# Patient Record
Sex: Female | Born: 1960 | Smoking: Former smoker
Health system: Southern US, Community
[De-identification: ages and names within clinical notes are randomized; demographics above are authoritative.]

## PROBLEM LIST (undated history)

## (undated) DIAGNOSIS — M2550 Pain in unspecified joint: Secondary | ICD-10-CM

## (undated) DIAGNOSIS — F329 Major depressive disorder, single episode, unspecified: Secondary | ICD-10-CM

## (undated) DIAGNOSIS — F419 Anxiety disorder, unspecified: Secondary | ICD-10-CM

## (undated) DIAGNOSIS — K5900 Constipation, unspecified: Secondary | ICD-10-CM

## (undated) DIAGNOSIS — F32A Depression, unspecified: Secondary | ICD-10-CM

## (undated) DIAGNOSIS — K829 Disease of gallbladder, unspecified: Secondary | ICD-10-CM

## (undated) DIAGNOSIS — E039 Hypothyroidism, unspecified: Secondary | ICD-10-CM

## (undated) DIAGNOSIS — G4700 Insomnia, unspecified: Secondary | ICD-10-CM

## (undated) DIAGNOSIS — E669 Obesity, unspecified: Secondary | ICD-10-CM

## (undated) HISTORY — PX: TUBAL LIGATION: SHX77

## (undated) HISTORY — DX: Hypothyroidism, unspecified: E03.9

## (undated) HISTORY — DX: Constipation, unspecified: K59.00

## (undated) HISTORY — DX: Disease of gallbladder, unspecified: K82.9

## (undated) HISTORY — DX: Obesity, unspecified: E66.9

## (undated) HISTORY — DX: Insomnia, unspecified: G47.00

## (undated) HISTORY — PX: GALLBLADDER SURGERY: SHX652

## (undated) HISTORY — DX: Pain in unspecified joint: M25.50

## (undated) HISTORY — DX: Anxiety disorder, unspecified: F41.9

## (undated) HISTORY — DX: Depression, unspecified: F32.A

## (undated) HISTORY — DX: Major depressive disorder, single episode, unspecified: F32.9

---

## 2010-06-04 ENCOUNTER — Other Ambulatory Visit (HOSPITAL_COMMUNITY)
Admission: RE | Admit: 2010-06-04 | Discharge: 2010-06-04 | Disposition: A | Discharge status: Home / Self Care | Payer: BC Managed Care – PPO | Source: Ambulatory Visit | Attending: Family Medicine | Admitting: Family Medicine

## 2010-06-04 DIAGNOSIS — Z124 Encounter for screening for malignant neoplasm of cervix: Secondary | ICD-10-CM | POA: Insufficient documentation

## 2014-01-03 ENCOUNTER — Other Ambulatory Visit: Payer: Self-pay | Admitting: Occupational Medicine

## 2014-01-03 ENCOUNTER — Ambulatory Visit: Payer: Self-pay

## 2014-01-03 DIAGNOSIS — M25572 Pain in left ankle and joints of left foot: Secondary | ICD-10-CM

## 2014-06-02 ENCOUNTER — Other Ambulatory Visit: Payer: Self-pay

## 2014-06-02 ENCOUNTER — Other Ambulatory Visit: Payer: Self-pay | Admitting: Family Medicine

## 2014-06-02 ENCOUNTER — Other Ambulatory Visit (HOSPITAL_COMMUNITY)
Admission: RE | Admit: 2014-06-02 | Discharge: 2014-06-02 | Disposition: A | Discharge status: Home / Self Care | Payer: 59 | Source: Ambulatory Visit | Attending: Family Medicine | Admitting: Family Medicine

## 2014-06-02 DIAGNOSIS — Z1231 Encounter for screening mammogram for malignant neoplasm of breast: Secondary | ICD-10-CM

## 2014-06-02 DIAGNOSIS — Z124 Encounter for screening for malignant neoplasm of cervix: Secondary | ICD-10-CM | POA: Insufficient documentation

## 2014-06-07 LAB — CYTOLOGY - PAP

## 2014-06-18 ENCOUNTER — Encounter: Payer: 59 | Attending: Family Medicine | Admitting: Dietician

## 2014-06-18 DIAGNOSIS — Z713 Dietary counseling and surveillance: Secondary | ICD-10-CM | POA: Insufficient documentation

## 2014-06-18 NOTE — Progress Notes (Signed)
Patient was seen on 06/18/2014 for the Weight Loss Class at the [facility]. The following learning objectives were met by the patient during this class:   Describe healthy choices in each food group  Describe portion size of foods  Use plate method for meal planning  Demonstrate how to read Nutrition Facts food label  Set realistic goals for weight loss, diet changes, and physical activity.   Goals:  1. Make healthy food choices in each food group.  2. Reduce portion size of foods.  3. Increase fruit and vegetable intake.  4. Use plate method for meal planning.  5. Increase physical activity.    Handouts given:   1. Nutrition Strategies for Weight Loss   2. Meal plan/portion card   3. MyPlate Planner   4. Weight Management Recipe Resources   5. Bake, Broil, Grill  6. Nutrition Fact Label  7. Vegetarian Protein List  8. Low Carb Snack Suggestions

## 2014-06-20 ENCOUNTER — Encounter (INDEPENDENT_AMBULATORY_CARE_PROVIDER_SITE_OTHER): Payer: Self-pay

## 2014-06-20 ENCOUNTER — Ambulatory Visit
Admission: RE | Admit: 2014-06-20 | Discharge: 2014-06-20 | Disposition: A | Discharge status: Home / Self Care | Payer: 59 | Source: Ambulatory Visit

## 2014-06-20 DIAGNOSIS — Z1231 Encounter for screening mammogram for malignant neoplasm of breast: Secondary | ICD-10-CM

## 2014-06-23 ENCOUNTER — Other Ambulatory Visit: Payer: Self-pay | Admitting: Family Medicine

## 2014-06-23 DIAGNOSIS — N63 Unspecified lump in unspecified breast: Secondary | ICD-10-CM

## 2014-06-28 ENCOUNTER — Ambulatory Visit
Admission: RE | Admit: 2014-06-28 | Discharge: 2014-06-28 | Disposition: A | Discharge status: Home / Self Care | Payer: 59 | Source: Ambulatory Visit | Attending: Family Medicine | Admitting: Family Medicine

## 2014-06-28 DIAGNOSIS — N63 Unspecified lump in unspecified breast: Secondary | ICD-10-CM

## 2014-12-13 ENCOUNTER — Other Ambulatory Visit: Payer: Self-pay | Admitting: Family Medicine

## 2014-12-13 DIAGNOSIS — N6320 Unspecified lump in the left breast, unspecified quadrant: Secondary | ICD-10-CM

## 2014-12-26 ENCOUNTER — Other Ambulatory Visit: Payer: Self-pay

## 2015-02-15 DIAGNOSIS — F4310 Post-traumatic stress disorder, unspecified: Secondary | ICD-10-CM | POA: Diagnosis not present

## 2015-02-15 DIAGNOSIS — F331 Major depressive disorder, recurrent, moderate: Secondary | ICD-10-CM | POA: Diagnosis not present

## 2015-02-17 MED FILL — TRINTELLIX 20 MG TABLET: 20 | 90 days supply | Qty: 90 | Fill #0

## 2015-02-23 MED FILL — SYNTHROID 137 MCG TABLET: 137 | 30 days supply | Qty: 30 | Fill #0

## 2015-03-01 DIAGNOSIS — F4310 Post-traumatic stress disorder, unspecified: Secondary | ICD-10-CM | POA: Diagnosis not present

## 2015-03-01 DIAGNOSIS — F331 Major depressive disorder, recurrent, moderate: Secondary | ICD-10-CM | POA: Diagnosis not present

## 2015-03-08 DIAGNOSIS — F331 Major depressive disorder, recurrent, moderate: Secondary | ICD-10-CM | POA: Diagnosis not present

## 2015-03-08 DIAGNOSIS — F4310 Post-traumatic stress disorder, unspecified: Secondary | ICD-10-CM | POA: Diagnosis not present

## 2015-03-15 DIAGNOSIS — F4310 Post-traumatic stress disorder, unspecified: Secondary | ICD-10-CM | POA: Diagnosis not present

## 2015-03-15 DIAGNOSIS — F331 Major depressive disorder, recurrent, moderate: Secondary | ICD-10-CM | POA: Diagnosis not present

## 2015-03-21 DIAGNOSIS — F331 Major depressive disorder, recurrent, moderate: Secondary | ICD-10-CM | POA: Diagnosis not present

## 2015-03-21 DIAGNOSIS — F4310 Post-traumatic stress disorder, unspecified: Secondary | ICD-10-CM | POA: Diagnosis not present

## 2015-04-05 DIAGNOSIS — F331 Major depressive disorder, recurrent, moderate: Secondary | ICD-10-CM | POA: Diagnosis not present

## 2015-04-05 DIAGNOSIS — F4310 Post-traumatic stress disorder, unspecified: Secondary | ICD-10-CM | POA: Diagnosis not present

## 2015-04-05 MED FILL — SYNTHROID 137 MCG TABLET: 137 | 90 days supply | Qty: 90 | Fill #0

## 2015-05-08 DIAGNOSIS — J029 Acute pharyngitis, unspecified: Secondary | ICD-10-CM | POA: Diagnosis not present

## 2015-05-08 MED FILL — AMOX-CLAV 500-125 MG TABLET: 500-125 | 10 days supply | Qty: 20 | Fill #0

## 2015-05-29 DIAGNOSIS — G479 Sleep disorder, unspecified: Secondary | ICD-10-CM | POA: Diagnosis not present

## 2015-05-29 DIAGNOSIS — Z1211 Encounter for screening for malignant neoplasm of colon: Secondary | ICD-10-CM | POA: Diagnosis not present

## 2015-05-29 DIAGNOSIS — E039 Hypothyroidism, unspecified: Secondary | ICD-10-CM | POA: Diagnosis not present

## 2015-05-29 DIAGNOSIS — R928 Other abnormal and inconclusive findings on diagnostic imaging of breast: Secondary | ICD-10-CM | POA: Diagnosis not present

## 2015-05-29 DIAGNOSIS — L709 Acne, unspecified: Secondary | ICD-10-CM | POA: Diagnosis not present

## 2015-05-29 DIAGNOSIS — Z0000 Encounter for general adult medical examination without abnormal findings: Secondary | ICD-10-CM | POA: Diagnosis not present

## 2015-05-29 DIAGNOSIS — F339 Major depressive disorder, recurrent, unspecified: Secondary | ICD-10-CM | POA: Diagnosis not present

## 2015-05-29 DIAGNOSIS — F419 Anxiety disorder, unspecified: Secondary | ICD-10-CM | POA: Diagnosis not present

## 2015-05-30 ENCOUNTER — Other Ambulatory Visit: Payer: Self-pay | Admitting: Family Medicine

## 2015-05-30 DIAGNOSIS — N6320 Unspecified lump in the left breast, unspecified quadrant: Secondary | ICD-10-CM

## 2015-06-06 MED FILL — TRINTELLIX 20 MG TABLET: 20 | 90 days supply | Qty: 90 | Fill #0

## 2015-06-29 ENCOUNTER — Other Ambulatory Visit: Payer: Self-pay

## 2015-07-13 MED FILL — SYNTHROID 137 MCG TABLET: 137 | 90 days supply | Qty: 90 | Fill #0

## 2015-08-25 ENCOUNTER — Ambulatory Visit
Admission: RE | Admit: 2015-08-25 | Discharge: 2015-08-25 | Disposition: A | Discharge status: Home / Self Care | Payer: 59 | Source: Ambulatory Visit | Attending: Family Medicine | Admitting: Family Medicine

## 2015-08-25 DIAGNOSIS — N6320 Unspecified lump in the left breast, unspecified quadrant: Secondary | ICD-10-CM

## 2015-08-25 DIAGNOSIS — N63 Unspecified lump in breast: Secondary | ICD-10-CM | POA: Diagnosis not present

## 2015-09-18 MED FILL — TRINTELLIX 20 MG TABLET: 20 | 90 days supply | Qty: 90 | Fill #1

## 2015-11-01 MED FILL — SYNTHROID 137 MCG TABLET: 137 | 90 days supply | Qty: 90 | Fill #1

## 2015-12-20 DIAGNOSIS — E039 Hypothyroidism, unspecified: Secondary | ICD-10-CM | POA: Diagnosis not present

## 2015-12-20 DIAGNOSIS — Z713 Dietary counseling and surveillance: Secondary | ICD-10-CM | POA: Diagnosis not present

## 2015-12-20 DIAGNOSIS — E6609 Other obesity due to excess calories: Secondary | ICD-10-CM | POA: Diagnosis not present

## 2015-12-20 DIAGNOSIS — Z6833 Body mass index (BMI) 33.0-33.9, adult: Secondary | ICD-10-CM | POA: Diagnosis not present

## 2015-12-20 MED FILL — QSYMIA 3.75 MG-23 MG CAP: 3.75-23 | 14 days supply | Qty: 14 | Fill #0

## 2015-12-21 MED FILL — TRETINOIN 0.025% CREAM: 0.025 | 30 days supply | Qty: 20 | Fill #0

## 2016-01-01 MED FILL — QSYMIA 3.75 MG-23 MG CAP: 3.75-23 | 15 days supply | Qty: 30 | Fill #1

## 2016-01-25 ENCOUNTER — Encounter (INDEPENDENT_AMBULATORY_CARE_PROVIDER_SITE_OTHER): Payer: 59 | Admitting: Family Medicine

## 2016-02-07 MED FILL — SYNTHROID 137 MCG TABLET: 137 | 90 days supply | Qty: 90 | Fill #2

## 2016-02-09 MED FILL — TRINTELLIX 20 MG TABLET: 20 | 90 days supply | Qty: 90 | Fill #2

## 2016-02-12 IMAGING — CR DG ANKLE COMPLETE 3+V*L*
3 series · 3 of 3 positions shown · non-contrast
Comparison: None.

CLINICAL DATA: Fell today with pain laterally

EXAM:
LEFT ANKLE COMPLETE - 3+ VIEW

[view not recorded (1 of 3)]
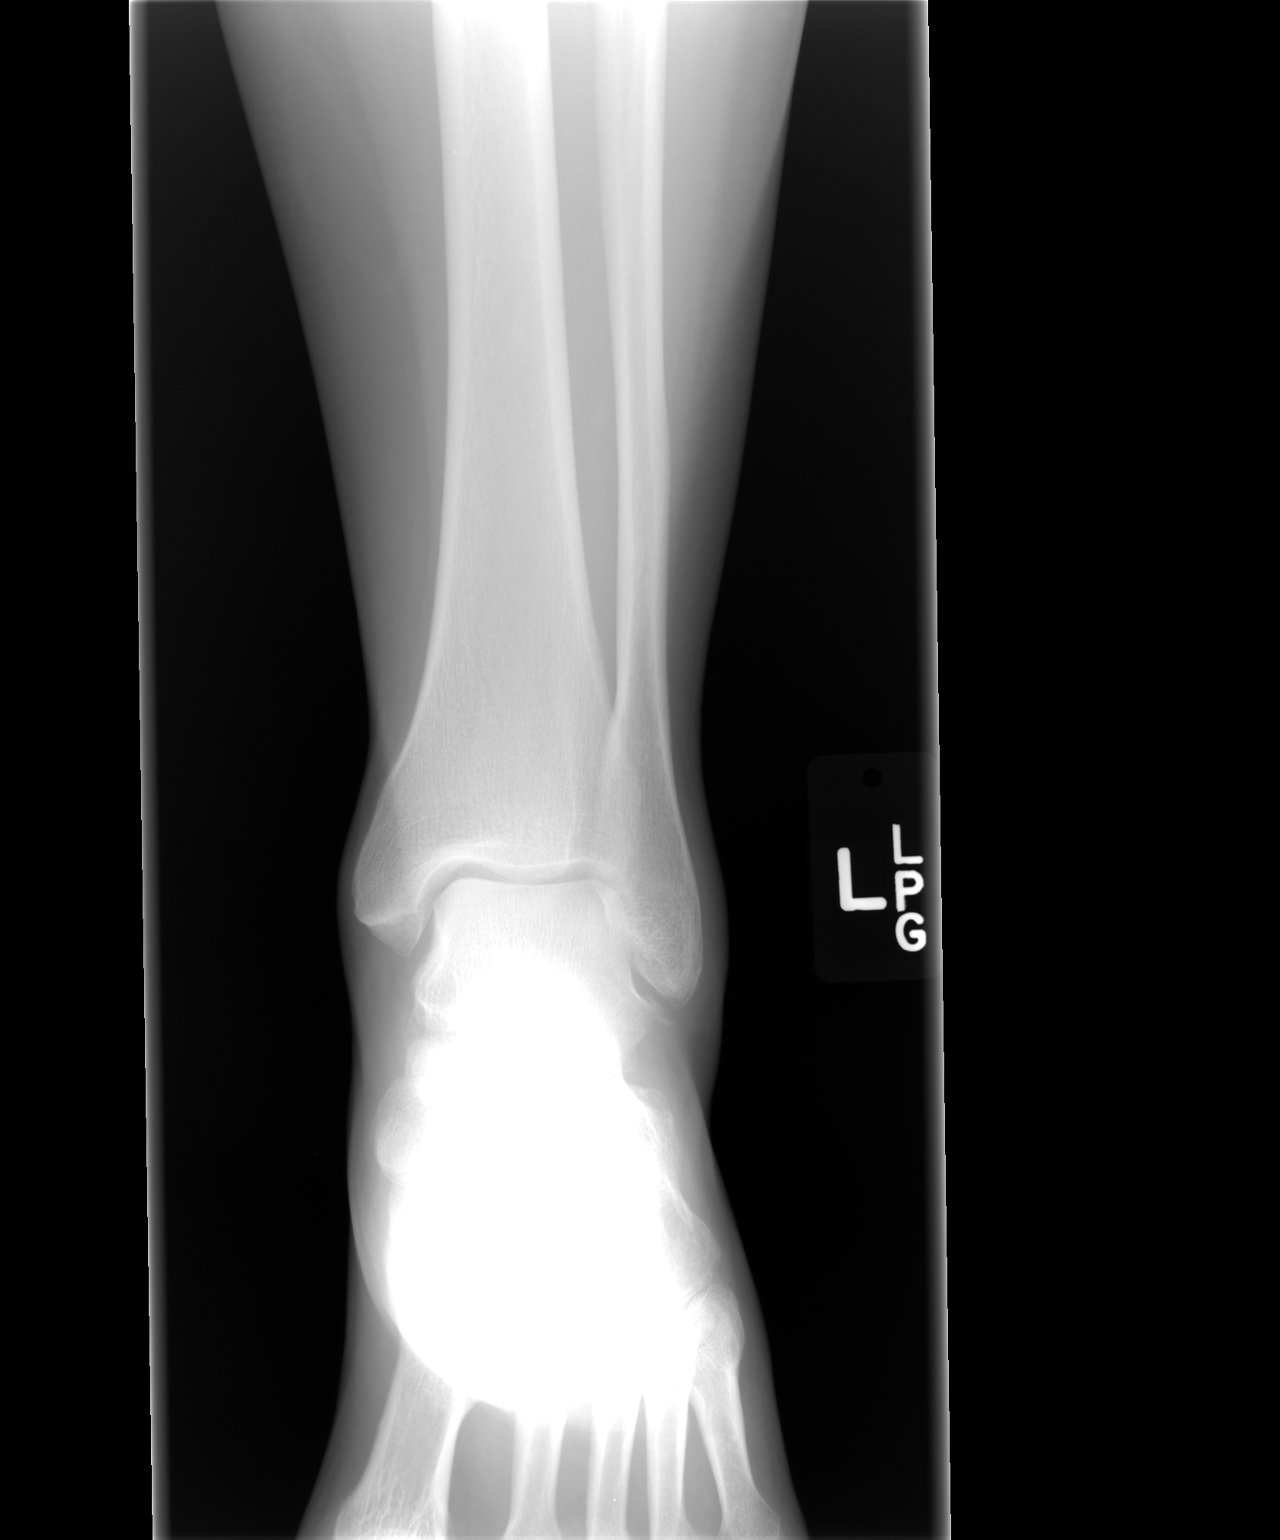

[view not recorded (2 of 3)]
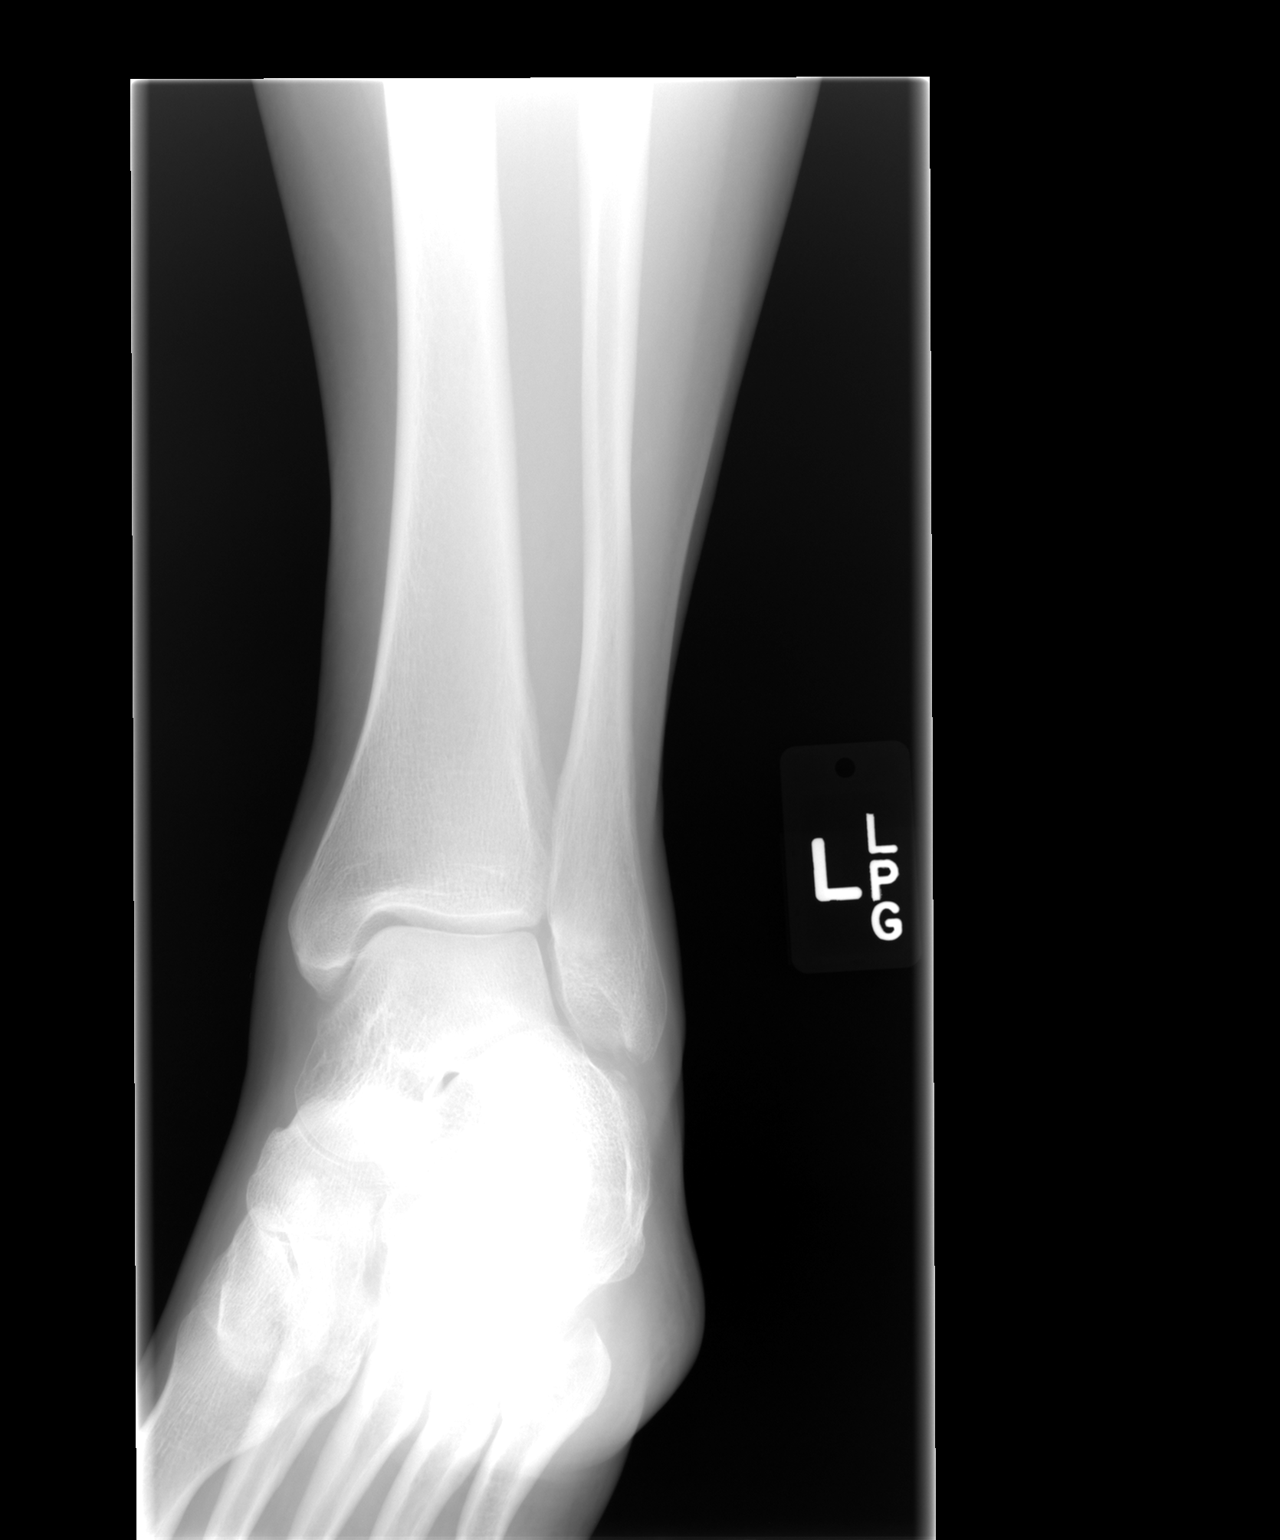

[view not recorded (3 of 3)]
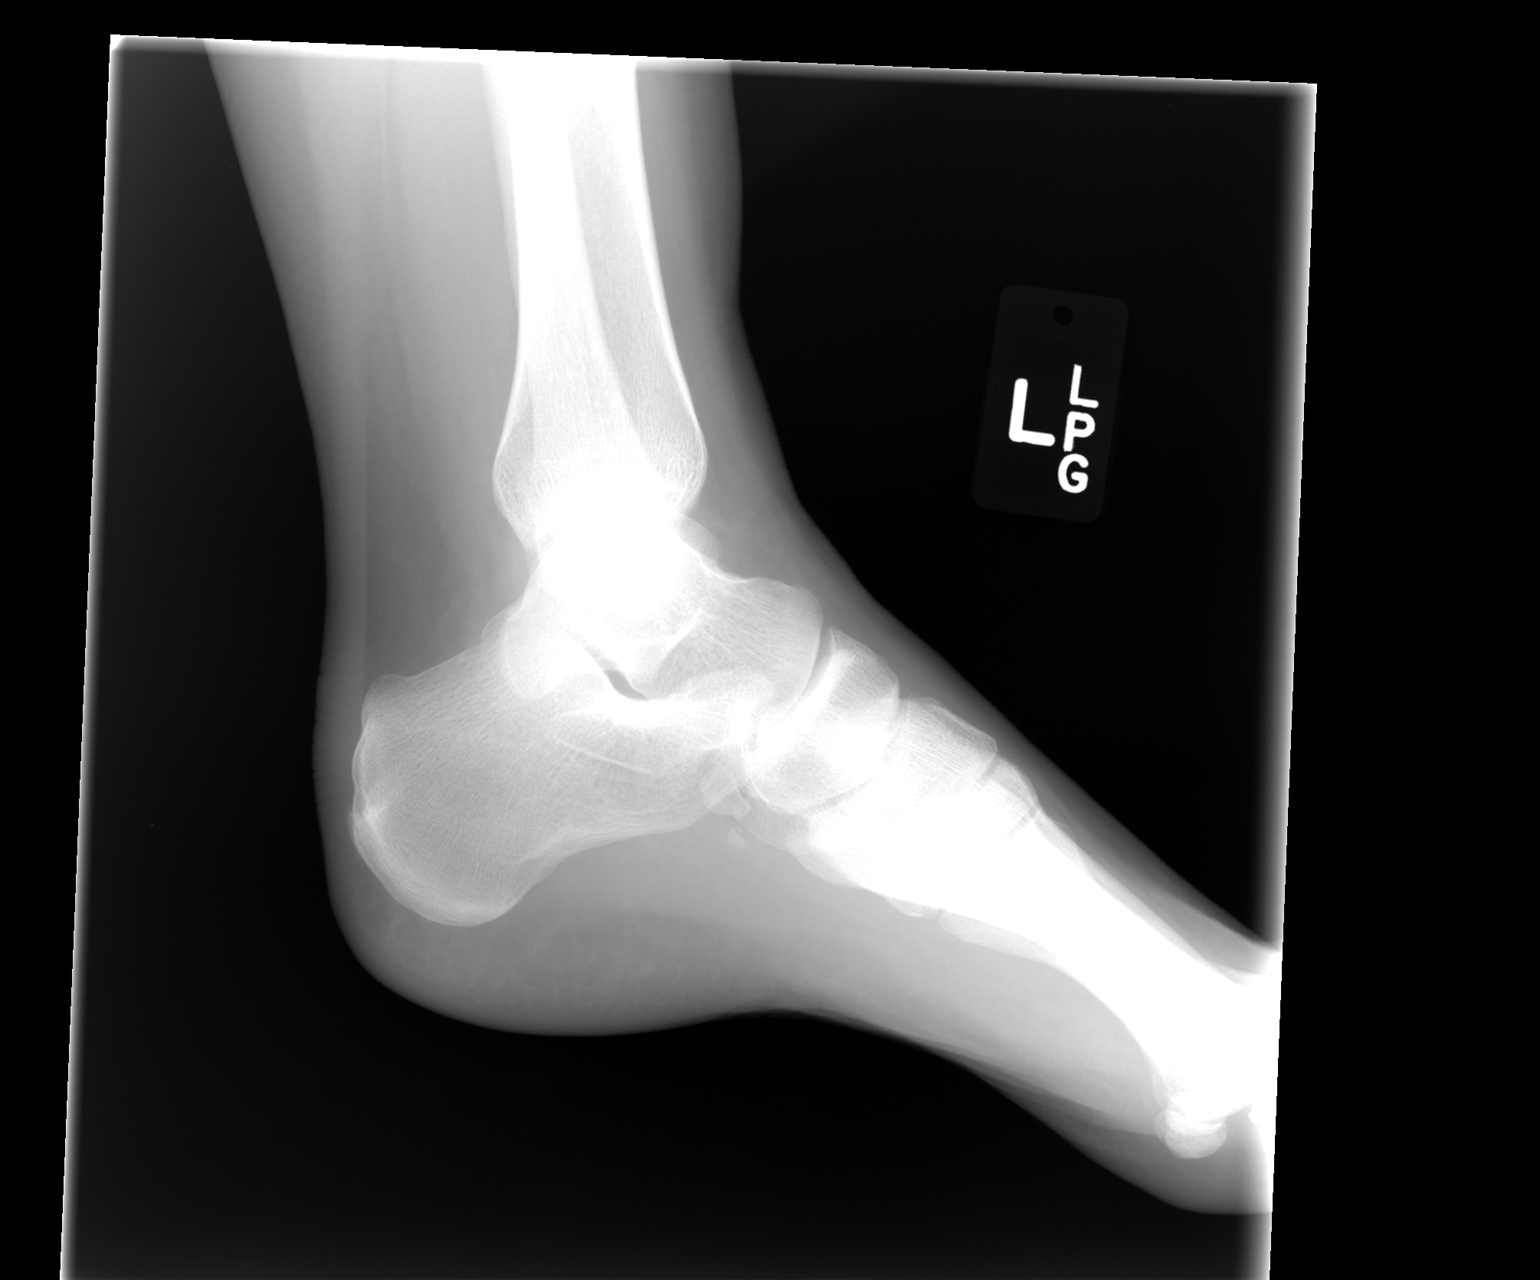

[3 of 3 positions shown; findings below may reference images not displayed]

FINDINGS: There is a small thin bony density just inferior to the tip of the
lateral malleolus most consistent with a small avulsion fracture
fragment. The ankle joint appears normal. Alignment is normal.
IMPRESSION: Small avulsion fracture fragment from the tip of the distal left
fibula.

## 2016-02-13 ENCOUNTER — Ambulatory Visit (INDEPENDENT_AMBULATORY_CARE_PROVIDER_SITE_OTHER): Payer: 59 | Admitting: Family Medicine

## 2016-02-13 ENCOUNTER — Encounter (INDEPENDENT_AMBULATORY_CARE_PROVIDER_SITE_OTHER): Payer: Self-pay | Admitting: Family Medicine

## 2016-02-13 VITALS — BP 129/83 | HR 65 | Temp 97.7°F | Resp 15 | Ht 69.0 in | Wt 223.0 lb

## 2016-02-13 DIAGNOSIS — E669 Obesity, unspecified: Secondary | ICD-10-CM

## 2016-02-13 DIAGNOSIS — Z1389 Encounter for screening for other disorder: Secondary | ICD-10-CM | POA: Diagnosis not present

## 2016-02-13 DIAGNOSIS — R0602 Shortness of breath: Secondary | ICD-10-CM

## 2016-02-13 DIAGNOSIS — Z6833 Body mass index (BMI) 33.0-33.9, adult: Secondary | ICD-10-CM | POA: Diagnosis not present

## 2016-02-13 DIAGNOSIS — R739 Hyperglycemia, unspecified: Secondary | ICD-10-CM | POA: Diagnosis not present

## 2016-02-13 DIAGNOSIS — E039 Hypothyroidism, unspecified: Secondary | ICD-10-CM | POA: Diagnosis not present

## 2016-02-13 DIAGNOSIS — R5383 Other fatigue: Secondary | ICD-10-CM

## 2016-02-13 DIAGNOSIS — Z9189 Other specified personal risk factors, not elsewhere classified: Secondary | ICD-10-CM | POA: Diagnosis not present

## 2016-02-13 DIAGNOSIS — Z1331 Encounter for screening for depression: Secondary | ICD-10-CM

## 2016-02-13 DIAGNOSIS — Z0289 Encounter for other administrative examinations: Secondary | ICD-10-CM

## 2016-02-13 NOTE — Progress Notes (Signed)
Office: [phone]  /  Fax: [phone]   HPI:   Chief Complaint: OBESITY  Jennifer Riley (MR# [id number]) is a 56 y.o. female who presents on 02/13/2016 for obesity evaluation and treatment. Current BMI is Body mass index is 32.93 kg/m.Marland Kitchen Jennifer Riley has struggled with obesity for years and has been unsuccessful in either losing weight or maintaining long term weight loss. Jennifer Riley attended our information session and states she is currently in the action stage of change and ready to dedicate time achieving and maintaining a healthier weight.  Jennifer Riley states her desired weight loss is 80 lbs. Jennifer Riley states she eats out three times a week. She considers herself a picky eater. Jennifer Riley tends to snack on sweets to include chocolate, cookies, and ice cream. Jennifer Riley states she is trying to become a vegetaarin. Jennifer Riley states she eats until she feels stuffed. She is an Surveyor, quantity. Jennifer Riley states she is a binge eater.      Fatigue Jennifer Riley feels her energy is lower than it should be. This has worsened with weight gain and has not worsened recently. Jennifer Riley admits to daytime somnolence and  reports waking up still tired. Patient is at risk for obstructive sleep apnea. Patent has a history of symptoms of morning fatigue. Patient generally gets 3 or 4 hours of sleep per night, and states they generally have difficulty falling asleep. Snoring is present. Apneic episodes is not present. Epworth Sleepiness Score is 6.  Dyspnea on exertion Jennifer Riley notes increasing shortness of breath with exercising and seems to be worsening over time with weight gain. She notes getting out of breath sooner with activity than she used to. This has not gotten worse recently. Jennifer Riley denies orthopnea.  Hypothyroidism On Synthroid, denies palpitations, denies hot/cold intolerance.  Hyperglycemia Last fasting glucose was 108. Positive for polyphagia.    Depression Screen Jennifer Riley's Food and Mood  (modified PHQ-9) score was  Depression screen PHQ 2/9 02/13/2016  Decreased Interest 3  Down, Depressed, Hopeless 3  PHQ - 2 Score 6  Altered sleeping 3  Tired, decreased energy 3  Change in appetite 3  Feeling bad or failure about yourself  2  Trouble concentrating 2  Moving slowly or fidgety/restless 2  Suicidal thoughts 0  PHQ-9 Score 21    ALLERGIES: Allergies not on file  MEDICATIONS: No current outpatient prescriptions on file prior to visit.   No current facility-administered medications on file prior to visit.     PAST MEDICAL HISTORY: Past Medical History:  Diagnosis Date  . Anxiety   . Constipation   . Depression   . Gallbladder problem   . Hypothyroidism   . Insomnia   . Joint pain   . Obesity     PAST SURGICAL HISTORY: Past Surgical History:  Procedure Laterality Date  . NO PAST SURGERIES      SOCIAL HISTORY: Social History  Substance Use Topics  . Smoking status: Former Smoker    Quit date: 02/12/1993  . Smokeless tobacco: Never Used  . Alcohol use Not on file    FAMILY HISTORY: Family History  Problem Relation Age of Onset  . Alcohol abuse Mother   . Cancer Father   . Alcohol abuse Father   . Kidney disease Father     ROS: Review of Systems  Constitutional: Positive for malaise/fatigue.       Trouble Sleeping  Eyes:       Wear Glasses or Contacts Floaters  Respiratory:       Dry Mouth  Cardiovascular:  Shortness of breath with Activity  Gastrointestinal: Positive for nausea.  Musculoskeletal: Positive for joint pain.       Muscle pain  Skin:       Dryness Hair or Nail changes  Endo/Heme/Allergies: Bruises/bleeds easily.       Frequent Urination Excessive Thirst Excessive Hunger  Psychiatric/Behavioral: Positive for depression.       Stress  All other systems reviewed and are negative.   PHYSICAL EXAM: Blood pressure 129/83, pulse 65, temperature 97.7 F (36.5 C), temperature source Oral, resp. rate 15, height 5'  9" (1.753 m), weight 223 lb (101.2 kg), SpO2 97 %. Body mass index is 32.93 kg/m. Physical Exam  RECENT LABS AND TESTS: BMET No results found for: NA, K, CL, CO2, GLUCOSE, BUN, CREATININE, CALCIUM, GFRNONAA, GFRAA No results found for: HGBA1C No results found for: INSULIN CBC No results found for: WBC, RBC, HGB, HCT, PLT, MCV, MCH, MCHC, RDW, LYMPHSABS, MONOABS, EOSABS, BASOSABS Iron/TIBC/Ferritin/ %Sat No results found for: IRON, TIBC, FERRITIN, IRONPCTSAT Lipid Panel  No results found for: CHOL, TRIG, HDL, CHOLHDL, VLDL, LDLCALC, LDLDIRECT Hepatic Function Panel  No results found for: PROT, ALBUMIN, AST, ALT, ALKPHOS, BILITOT, BILIDIR, IBILI No results found for: TSH  ECG  shows NSR with a rate of 60. INDIRECT CALORIMETER done today shows a VO2 of 267 and a REE of 1862.    ASSESSMENT AND PLAN: Other fatigue - Plan: EKG 12-Lead, Comprehensive metabolic panel, CBC With Differential, Vitamin B12, T3, T4, free, TSH, VITAMIN D 25 Hydroxy (Vit-D Deficiency, Fractures), Folate  Shortness of breath - Plan: Lipid Panel With LDL/HDL Ratio  Acquired hypothyroidism  Hyperglycemia - Plan: Hemoglobin A1c, Insulin, random  At risk for diabetes mellitus  Depression screening  Class 1 obesity without serious comorbidity with body mass index (BMI) of 33.0 to 33.9 in adult, unspecified obesity type  PLAN:  Fatigue Jennifer Riley was informed that her fatigue may be related to obesity, depression or many other causes. Labs will be ordered, and in the meanwhile Jennifer Riley has agreed to work on diet, exercise and weight loss to help with fatigue. Proper sleep hygiene was discussed including the need for 7-8 hours of quality sleep each night. A sleep study was not ordered based on symptoms and Epworth score.  Exercise Induced Shortness of Breath [name]'s shortness of breath appears to be obesity related and exercise induced. She has agreed to work on weight loss and gradually increase exercise  to treat her exercise induced shortness of breath. If Jennifer Riley follows our instructions and loses weight without improvement of her shortness of breath, we will plan to refer to pulmonology. We will monitor this condition regularly. Jennifer Riley agrees to this plan.  Hypothyroidism Will recheck labs.   Hyperglycemia Will recheck labs and follow.   Depression Screen Jennifer Riley had a positive depression screening. Depression is commonly associated with obesity and often results in emotional eating behaviors. We will monitor this closely and work on CBT to help improve the non-hunger eating patterns. Referral to Psychology may be required if no improvement is seen as she continues in our clinic.  Risk counselling Diabetes II Jennifer Riley has been given extensive diabetes education by myself today including ideal fasting and post-prandial blood glucose readings, individual ideal HgA1c goals  and hypoglycemia prevention. We discussed the importance of good blood sugar control to decrease the likelihood of diabetic complications such as nephropathy, neuropathy, limb loss, blindness, coronary artery disease, and death. We discussed the importance of intensive lifestyle modification including diet, exercise and weight  loss as the first line treatment for diabetes. Jennifer LeatherwoodKatherine agrees to continue her diabetes medications and will follow up at the agreed upon time.  Obesity Jennifer LeatherwoodKatherine is currently in the action stage of change and her goal is to get back to weightloss efforts  She has agreed to follow the Category 2 plan Jennifer LeatherwoodKatherine has been instructed to work up to a goal of 150 minutes of combined cardio and strengthening exercise per week for weight loss and overall health benefits. We discussed the following Behavioral Modification Stratagies today: increasing lean protein intake, decreasing simple carbohydrates, work on meal planning and easy cooking.   Jennifer LeatherwoodKatherine has agreed to follow up with our clinic in 2  weeks. She was informed of the importance of frequent follow up visits to maximize her success with intensive lifestyle modifications for her multiple health conditions. She was informed we would discuss her lab results at her next visit unless there is a critical issue that needs to be addressed sooner. Jennifer LeatherwoodKatherine agreed to keep her next visit at the agreed upon time to discuss these results.  I, [place] CraneJoanne Murray, am acting as scribe for Quillian Quincearen Shyleigh Daughtry, MD  I have reviewed the above documentation for accuracy and completeness, and I agree with the above. -Quillian Quincearen Dushaun Okey, MD

## 2016-02-14 LAB — COMPREHENSIVE METABOLIC PANEL
ALK PHOS: 80 IU/L (ref 39–117)
ALT: 15 IU/L (ref 0–32)
AST: 14 IU/L (ref 0–40)
Albumin/Globulin Ratio: 1.6 (ref 1.2–2.2)
Albumin: 4.6 g/dL (ref 3.5–5.5)
BILIRUBIN TOTAL: 0.3 mg/dL (ref 0.0–1.2)
BUN/Creatinine Ratio: 17 (ref 9–23)
BUN: 17 mg/dL (ref 6–24)
CHLORIDE: 103 mmol/L (ref 96–106)
CO2: 27 mmol/L (ref 18–29)
Calcium: 9.7 mg/dL (ref 8.7–10.2)
Creatinine, Ser: 1 mg/dL (ref 0.57–1.00)
GFR calc Af Amer: 74 mL/min/{1.73_m2} (ref >59)
GFR calc non Af Amer: 64 mL/min/{1.73_m2} (ref >59)
Globulin, Total: 2.8 g/dL (ref 1.5–4.5)
Glucose: 93 mg/dL (ref 65–99)
POTASSIUM: 4.5 mmol/L (ref 3.5–5.2)
Sodium: 144 mmol/L (ref 134–144)
Total Protein: 7.4 g/dL (ref 6.0–8.5)

## 2016-02-14 LAB — FOLATE: Folate: 11.1 ng/mL (ref >3.0)

## 2016-02-14 LAB — CBC WITH DIFFERENTIAL
BASOS ABS: 0 10*3/uL (ref 0.0–0.2)
Basos: 0 %
EOS (ABSOLUTE): 0.1 10*3/uL (ref 0.0–0.4)
EOS: 1 %
HEMATOCRIT: 44.7 % (ref 34.0–46.6)
HEMOGLOBIN: 15.2 g/dL (ref 11.1–15.9)
IMMATURE GRANULOCYTES: 0 %
Immature Grans (Abs): 0 10*3/uL (ref 0.0–0.1)
Lymphocytes Absolute: 2.1 10*3/uL (ref 0.7–3.1)
Lymphs: 30 %
MCH: 30 pg (ref 26.6–33.0)
MCHC: 34 g/dL (ref 31.5–35.7)
MCV: 88 fL (ref 79–97)
MONOCYTES: 6 %
Monocytes Absolute: 0.5 10*3/uL (ref 0.1–0.9)
Neutrophils Absolute: 4.3 10*3/uL (ref 1.4–7.0)
Neutrophils: 63 %
RBC: 5.06 x10E6/uL (ref 3.77–5.28)
RDW: 13.7 % (ref 12.3–15.4)
WBC: 7 10*3/uL (ref 3.4–10.8)

## 2016-02-14 LAB — T3: T3, Total: 92 ng/dL (ref 71–180)

## 2016-02-14 LAB — VITAMIN D 25 HYDROXY (VIT D DEFICIENCY, FRACTURES): VIT D 25 HYDROXY: 25.7 ng/mL — AB (ref 30.0–100.0)

## 2016-02-14 LAB — INSULIN, RANDOM: INSULIN: 20.1 u[IU]/mL (ref 2.6–24.9)

## 2016-02-14 LAB — LIPID PANEL WITH LDL/HDL RATIO
CHOLESTEROL TOTAL: 205 mg/dL — AB (ref 100–199)
HDL: 56 mg/dL (ref >39)
LDL Calculated: 109 mg/dL — ABNORMAL HIGH (ref 0–99)
LDl/HDL Ratio: 1.9 ratio units (ref 0.0–3.2)
Triglycerides: 201 mg/dL — ABNORMAL HIGH (ref 0–149)
VLDL Cholesterol Cal: 40 mg/dL (ref 5–40)

## 2016-02-14 LAB — HEMOGLOBIN A1C
ESTIMATED AVERAGE GLUCOSE: 117 mg/dL
HEMOGLOBIN A1C: 5.7 % — AB (ref 4.8–5.6)

## 2016-02-14 LAB — TSH: TSH: 7.14 u[IU]/mL — ABNORMAL HIGH (ref 0.450–4.500)

## 2016-02-14 LAB — T4, FREE: FREE T4: 1.07 ng/dL (ref 0.82–1.77)

## 2016-02-14 LAB — VITAMIN B12: VITAMIN B 12: 319 pg/mL (ref 232–1245)

## 2016-02-20 ENCOUNTER — Encounter (INDEPENDENT_AMBULATORY_CARE_PROVIDER_SITE_OTHER): Payer: Self-pay | Admitting: Family Medicine

## 2016-02-27 ENCOUNTER — Ambulatory Visit (INDEPENDENT_AMBULATORY_CARE_PROVIDER_SITE_OTHER): Payer: 59 | Admitting: Family Medicine

## 2016-02-27 ENCOUNTER — Encounter (INDEPENDENT_AMBULATORY_CARE_PROVIDER_SITE_OTHER): Payer: Self-pay | Admitting: Family Medicine

## 2016-02-27 VITALS — BP 131/79 | HR 67 | Temp 97.9°F | Ht 69.0 in | Wt 219.0 lb

## 2016-02-27 DIAGNOSIS — E669 Obesity, unspecified: Secondary | ICD-10-CM | POA: Insufficient documentation

## 2016-02-27 DIAGNOSIS — Z9189 Other specified personal risk factors, not elsewhere classified: Secondary | ICD-10-CM | POA: Diagnosis not present

## 2016-02-27 DIAGNOSIS — Z6832 Body mass index (BMI) 32.0-32.9, adult: Secondary | ICD-10-CM | POA: Diagnosis not present

## 2016-02-27 DIAGNOSIS — E782 Mixed hyperlipidemia: Secondary | ICD-10-CM | POA: Insufficient documentation

## 2016-02-27 DIAGNOSIS — E781 Pure hyperglyceridemia: Secondary | ICD-10-CM | POA: Diagnosis not present

## 2016-02-27 DIAGNOSIS — R7303 Prediabetes: Secondary | ICD-10-CM

## 2016-02-27 DIAGNOSIS — E559 Vitamin D deficiency, unspecified: Secondary | ICD-10-CM | POA: Diagnosis not present

## 2016-02-27 MED ORDER — VITAMIN D (ERGOCALCIFEROL) 1.25 MG (50000 UNIT) PO CAPS: 50000.0000 [IU] | ORAL_CAPSULE | ORAL | 0 refills | Status: DC

## 2016-02-27 MED ORDER — METFORMIN HCL 500 MG PO TABS: 500.0000 mg | ORAL_TABLET | Freq: Every day | ORAL | 0 refills | Status: DC

## 2016-02-27 NOTE — Progress Notes (Signed)
Office: [phone]  /  Fax: [phone]   HPI:   Chief Complaint: OBESITY Jennifer Riley is here to discuss her progress with her obesity treatment plan. She is following her eating plan approximately 100 % of the time and states she is exercising 0 minutes 0 times per week. Jennifer Riley did well on category 3 plan. Her weight is 219 lb (99.3 kg) today and has had a weight loss of 4 pounds over a period of 2 weeks since her last visit. She has lost 4 lbs since starting treatment with us.  Vitamin D deficiency Jennifer Riley has a diagnosis of vitamin D deficiency. She is not currently taking vit D and denies nausea, vomiting or muscle weakness, admits to fatigue.  Pre-Diabetes Jennifer Riley has a diagnosis of prediabetes based on her elevated HgA1c at 5.7 and was informed this puts her at greater risk of developing diabetes. She is not taking metformin currently and continues to work on diet and exercise to decrease risk of diabetes. She denies nausea or hypoglycemia, admits to polyphagia.  Hypotriglyceridemia Jennifer Riley was informed of her elevated triglycerides. She currently denies abdominal pain.  At risk for cardiovascular disease Jennifer Riley is at a higher than average risk for cardiovascular disease due to obesity. She currently denies any chest pain.    Wt Readings from Last 500 Encounters:  02/27/16 219 lb (99.3 kg)  02/13/16 223 lb (101.2 kg)     ALLERGIES: Allergies  Allergen Reactions   Wellbutrin [Bupropion]     delusion    MEDICATIONS: Current Outpatient Prescriptions on File Prior to Visit  Medication Sig Dispense Refill   ALPRAZolam (XANAX) 0.25 MG tablet Take 0.25 mg by mouth.     levothyroxine (SYNTHROID, LEVOTHROID) 137 MCG tablet Take 137 mcg by mouth daily before breakfast.     tretinoin (RETIN-A) 0.025 % cream Apply topically at bedtime.     vortioxetine HBr (TRINTELLIX) 20 MG TABS Take 20 mg by mouth every morning.     No current facility-administered  medications on file prior to visit.     PAST MEDICAL HISTORY: Past Medical History:  Diagnosis Date   Anxiety    Constipation    Depression    Gallbladder problem    Hypothyroidism    Insomnia    Joint pain    Obesity     PAST SURGICAL HISTORY: Past Surgical History:  Procedure Laterality Date   GALLBLADDER SURGERY     06/2001   TUBAL LIGATION     03/19/1990    SOCIAL HISTORY: Social History  Substance Use Topics   Smoking status: Former Smoker    Quit date: 02/12/1993   Smokeless tobacco: Never Used   Alcohol use Not on file    FAMILY HISTORY: Family History  Problem Relation Age of Onset   Alcohol abuse Mother    Cancer Father    Alcohol abuse Father    Kidney disease Father     ROS: Review of Systems  Constitutional: Positive for malaise/fatigue and weight loss.  Gastrointestinal: Negative for abdominal pain.  Musculoskeletal:       Negative Muscle Weakness  Endo/Heme/Allergies:       Positive Polyphagia    PHYSICAL EXAM: Blood pressure 131/79, pulse 67, temperature 97.9 F (36.6 C), temperature source Oral, height 5\' 9"  (1.753 m), weight 219 lb (99.3 kg), SpO2 96 %. Body mass index is 32.34 kg/m. Physical Exam  Constitutional: She is oriented to person, place, and time. She appears well-developed and well-nourished.  Cardiovascular: Normal rate.  Pulmonary/Chest: Effort normal.  Musculoskeletal: Normal range of motion.  Neurological: She is oriented to person, place, and time.  Skin: Skin is warm and dry.  Psychiatric: She has a normal mood and affect. Her behavior is normal.  Vitals reviewed.   RECENT LABS AND TESTS: BMET    Component Value Date/Time   NA 144 02/13/2016 0947   K 4.5 02/13/2016 0947   CL 103 02/13/2016 0947   CO2 27 02/13/2016 0947   GLUCOSE 93 02/13/2016 0947   BUN 17 02/13/2016 0947   CREATININE 1.00 02/13/2016 0947   CALCIUM 9.7 02/13/2016 0947   GFRNONAA 64 02/13/2016 0947   GFRAA 74 02/13/2016  0947   Lab Results  Component Value Date   HGBA1C 5.7 (H) 02/13/2016   Lab Results  Component Value Date   INSULIN 20.1 02/13/2016   CBC    Component Value Date/Time   WBC 7.0 02/13/2016 0947   RBC 5.06 02/13/2016 0947   HCT 44.7 02/13/2016 0947   MCV 88 02/13/2016 0947   MCH 30.0 02/13/2016 0947   MCHC 34.0 02/13/2016 0947   RDW 13.7 02/13/2016 0947   LYMPHSABS 2.1 02/13/2016 0947   EOSABS 0.1 02/13/2016 0947   BASOSABS 0.0 02/13/2016 0947   Iron/TIBC/Ferritin/ %Sat No results found for: IRON, TIBC, FERRITIN, IRONPCTSAT Lipid Panel     Component Value Date/Time   CHOL 205 (H) 02/13/2016 0947   TRIG 201 (H) 02/13/2016 0947   HDL 56 02/13/2016 0947   LDLCALC 109 (H) 02/13/2016 0947   Hepatic Function Panel     Component Value Date/Time   PROT 7.4 02/13/2016 0947   ALBUMIN 4.6 02/13/2016 0947   AST 14 02/13/2016 0947   ALT 15 02/13/2016 0947   ALKPHOS 80 02/13/2016 0947   BILITOT 0.3 02/13/2016 0947      Component Value Date/Time   TSH 7.140 (H) 02/13/2016 0947    ASSESSMENT AND PLAN: Vitamin D deficiency - Plan: Vitamin D, Ergocalciferol, (DRISDOL) 50000 units CAPS capsule  Prediabetes - Plan: metFORMIN (GLUCOPHAGE) 500 MG tablet  At risk for heart disease  Hypertriglyceridemia  Class 1 obesity without serious comorbidity with body mass index (BMI) of 32.0 to 32.9 in adult, unspecified obesity type  PLAN:   Vitamin D Deficiency Jennifer Leatherwood was informed that low vitamin D levels contributes to fatigue and are associated with obesity, breast, and colon cancer. She agrees to start to take prescription Vit D @50 ,000 IU every week #4 with no refills and will re-check labs in 3 months and will follow up for routine testing of vitamin D, at least 2-3 times per year. She was informed of the risk of over-replacement of vitamin D and agrees to not increase her dose unless he discusses this with [place] first.  Pre-Diabetes Jennifer Leatherwood will continue to work on weight  loss, exercise, and decreasing simple carbohydrates in her diet to help decrease the risk of diabetes. We dicussed metformin including benefits and risks. She was informed that eating too many simple carbohydrates or too many calories at one sitting increases the likelihood of GI side effects. Jennifer Leatherwood agreed to start to take metformin and a prescription was written today for 500 MG every morning #30 with no refills. Jennifer Leatherwood agreed to follow up with [place] as directed to monitor her progress.  Hypotriyglyceridemia Jennifer Leatherwood will continue with diet and exercise and will re-check labs in 3 months and follow.  Cardiovascular risk counselling Jennifer Leatherwood was given extended (at least 15 minutes) coronary artery disease prevention counseling today. She  is 56 y.o. female and has risk factors for heart disease including obesity. We discussed intensive lifestyle modifications today with an emphasis on specific weight loss instructions and strategies. Pt was also informed of the importance of increasing exercise and decreasing saturated fats to help prevent heart disease.  Obesity Jennifer Leatherwood is currently in the action stage of change. As such, her goal is to continue with weight loss efforts She has agreed to keep a food journal with 1300-1500 calories and 80 grams of protein, 30-50 grams sugar daily. Jennifer Leatherwood has been instructed to work up to a goal of 150 minutes of combined cardio and strengthening exercise per week for weight loss and overall health benefits. We discussed the following Behavioral Modification Stratagies today: increasing lean protein intake, decreasing simple carbohydrates , decrease eating out and work on meal planning and easy cooking plans  Jennifer Leatherwood has agreed to follow up with our clinic in 2 weeks. She was informed of the importance of frequent follow up visits to maximize her success with intensive lifestyle modifications for her multiple health conditions.  I, [place] Crane, am  acting as scribe for Quillian Quince, MD  I have reviewed the above documentation for accuracy and completeness, and I agree with the above. -Quillian Quince, MD

## 2016-02-28 MED FILL — metFORMIN HCL 500 MG TABS: 500 | 30 days supply | Qty: 30 | Fill #0

## 2016-02-28 MED FILL — VIT D2 1.25 MG (50,000 UNIT: 1.25 MG | 28 days supply | Qty: 4 | Fill #0

## 2016-03-14 ENCOUNTER — Ambulatory Visit (INDEPENDENT_AMBULATORY_CARE_PROVIDER_SITE_OTHER): Payer: 59 | Admitting: Family Medicine

## 2016-03-14 VITALS — BP 118/75 | HR 63 | Temp 97.9°F | Ht 69.0 in | Wt 216.0 lb

## 2016-03-14 DIAGNOSIS — R7303 Prediabetes: Secondary | ICD-10-CM | POA: Insufficient documentation

## 2016-03-14 DIAGNOSIS — E559 Vitamin D deficiency, unspecified: Secondary | ICD-10-CM

## 2016-03-14 DIAGNOSIS — Z6831 Body mass index (BMI) 31.0-31.9, adult: Secondary | ICD-10-CM

## 2016-03-14 DIAGNOSIS — M549 Dorsalgia, unspecified: Secondary | ICD-10-CM

## 2016-03-14 DIAGNOSIS — G8929 Other chronic pain: Secondary | ICD-10-CM | POA: Diagnosis not present

## 2016-03-14 DIAGNOSIS — Z9189 Other specified personal risk factors, not elsewhere classified: Secondary | ICD-10-CM

## 2016-03-14 DIAGNOSIS — E669 Obesity, unspecified: Secondary | ICD-10-CM | POA: Diagnosis not present

## 2016-03-14 MED ORDER — METFORMIN HCL 500 MG PO TABS: 500.0000 mg | ORAL_TABLET | Freq: Every day | ORAL | 0 refills | Status: DC

## 2016-03-14 NOTE — Progress Notes (Signed)
Office: [phone]  /  Fax: [phone]   HPI:   Chief Complaint: OBESITY Jennifer Riley is here to discuss her progress with her obesity treatment plan. She has done well with weight loss. She is following her eating plan approximately 75 % of the time and states she is exercising 0 minutes 0 times per week. Jennifer Riley is currently struggling with having some hunger issues. She noted some emotional eating.  Her weight is 216 lb (98 kg) today and has had a weight loss of 3 pounds over a period of 2 weeks since her last visit. She has lost 7 lbs since starting treatment with [place].  Pre-Diabetes Jennifer Riley has a diagnosis of prediabetes based on her elevated HgA1c and was informed this puts her at greater risk of developing diabetes. She has started taking metformin and continues to work on diet and exercise to decrease risk of diabetes. She denies nausea, diarrhea or hypoglycemia.  At risk for diabetes Jennifer Riley is at higher than averagerisk for developing diabetes due to her obesity. She currently denies polyuria or polydipsia.  Vitamin D deficiency Jennifer Riley has a diagnosis of vitamin D deficiency. She is currently taking vitamin D, but not yet at goal. She denies nausea, vomiting or muscle weakness.  Chronic Back Pain  Jennifer Riley notes chronic back pain and asks if regular massage would improve her pain.  Wt Readings from Last 500 Encounters:  03/14/16 216 lb (98 kg)  02/27/16 219 lb (99.3 kg)  02/13/16 223 lb (101.2 kg)     ALLERGIES: Allergies  Allergen Reactions  . Wellbutrin [Bupropion]     delusion    MEDICATIONS: Current Outpatient Prescriptions on File Prior to Visit  Medication Sig Dispense Refill  . ALPRAZolam (XANAX) 0.25 MG tablet Take 0.25 mg by mouth.    . levothyroxine (SYNTHROID, LEVOTHROID) 137 MCG tablet Take 137 mcg by mouth daily before breakfast.    . tretinoin (RETIN-A) 0.025 % cream Apply topically at bedtime.    . Vitamin D, Ergocalciferol, (DRISDOL)  50000 units CAPS capsule Take 1 capsule (50,000 Units total) by mouth every 7 (seven) days. 4 capsule 0  . vortioxetine HBr (TRINTELLIX) 20 MG TABS Take 20 mg by mouth every morning.     No current facility-administered medications on file prior to visit.     PAST MEDICAL HISTORY: Past Medical History:  Diagnosis Date  . Anxiety   . Constipation   . Depression   . Gallbladder problem   . Hypothyroidism   . Insomnia   . Joint pain   . Obesity     PAST SURGICAL HISTORY: Past Surgical History:  Procedure Laterality Date  . GALLBLADDER SURGERY     06/2001  . TUBAL LIGATION     03/19/1990    SOCIAL HISTORY: Social History  Substance Use Topics  . Smoking status: Former Smoker    Quit date: 02/12/1993  . Smokeless tobacco: Never Used  . Alcohol use Not on file    FAMILY HISTORY: Family History  Problem Relation Age of Onset  . Alcohol abuse Mother   . Cancer Father   . Alcohol abuse Father   . Kidney disease Father     ROS: Review of Systems  Constitutional: Positive for weight loss.  Gastrointestinal: Negative for diarrhea and nausea.  Musculoskeletal: Positive for back pain.       Negative Muscle Weakness  Endo/Heme/Allergies:       Positive polyphagia Negative Hypoglycemia    PHYSICAL EXAM: Blood pressure 118/75, pulse 63, temperature 97.9  F (36.6 C), temperature source Oral, height 5\' 9"  (1.753 m), weight 216 lb (98 kg), SpO2 95 %. Body mass index is 31.9 kg/m. Physical Exam  Constitutional: She is oriented to person, place, and time. She appears well-developed and well-nourished.  Cardiovascular: Normal rate.   Pulmonary/Chest: Effort normal.  Musculoskeletal: Normal range of motion. She exhibits edema (trace edema bilateral lower extremities).  Neurological: She is oriented to person, place, and time.  Skin: Skin is warm and dry.  Psychiatric: She has a normal mood and affect. Her behavior is normal.    RECENT LABS AND TESTS: BMET    Component  Value Date/Time   NA 144 02/13/2016 0947   K 4.5 02/13/2016 0947   CL 103 02/13/2016 0947   CO2 27 02/13/2016 0947   GLUCOSE 93 02/13/2016 0947   BUN 17 02/13/2016 0947   CREATININE 1.00 02/13/2016 0947   CALCIUM 9.7 02/13/2016 0947   GFRNONAA 64 02/13/2016 0947   GFRAA 74 02/13/2016 0947   Lab Results  Component Value Date   HGBA1C 5.7 (H) 02/13/2016   Lab Results  Component Value Date   INSULIN 20.1 02/13/2016   CBC    Component Value Date/Time   WBC 7.0 02/13/2016 0947   RBC 5.06 02/13/2016 0947   HCT 44.7 02/13/2016 0947   MCV 88 02/13/2016 0947   MCH 30.0 02/13/2016 0947   MCHC 34.0 02/13/2016 0947   RDW 13.7 02/13/2016 0947   LYMPHSABS 2.1 02/13/2016 0947   EOSABS 0.1 02/13/2016 0947   BASOSABS 0.0 02/13/2016 0947   Iron/TIBC/Ferritin/ %Sat No results found for: IRON, TIBC, FERRITIN, IRONPCTSAT Lipid Panel     Component Value Date/Time   CHOL 205 (H) 02/13/2016 0947   TRIG 201 (H) 02/13/2016 0947   HDL 56 02/13/2016 0947   LDLCALC 109 (H) 02/13/2016 0947   Hepatic Function Panel     Component Value Date/Time   PROT 7.4 02/13/2016 0947   ALBUMIN 4.6 02/13/2016 0947   AST 14 02/13/2016 0947   ALT 15 02/13/2016 0947   ALKPHOS 80 02/13/2016 0947   BILITOT 0.3 02/13/2016 0947      Component Value Date/Time   TSH 7.140 (H) 02/13/2016 0947    ASSESSMENT AND PLAN: Prediabetes - Plan: metFORMIN (GLUCOPHAGE) 500 MG tablet  Vitamin D deficiency  Chronic back pain, unspecified back location, unspecified back pain laterality  Class 1 obesity without serious comorbidity with body mass index (BMI) of 31.0 to 31.9 in adult, unspecified obesity type  PLAN:  Pre-Diabetes Jennifer LeatherwoodKatherine will continue to work on weight loss, exercise, and decreasing simple carbohydrates in her diet to help decrease the risk of diabetes.. She was informed that eating too many simple carbohydrates or too many calories at one sitting increases the likelihood of GI side effects.  Jennifer LeatherwoodKatherine agrees to continue to take metformin for now and a prescription was written today for 1 month refill and will re-check labs in 2 months. Jennifer LeatherwoodKatherine agreed to follow up with us as directed to monitor her progress.  Diabetes risk counselling Jennifer LeatherwoodKatherine was given extended (at least 15 minutes) diabetes prevention counseling today. She is 56 y.o. female and has risk factors for diabetes including obesity. We discussed intensive lifestyle modifications today with an emphasis on weight loss as well as increasing exercise and decreasing simple carbohydrates in her diet.  Vitamin D Deficiency Jennifer LeatherwoodKatherine was informed that low vitamin D levels contributes to fatigue and are associated with obesity, breast, and colon cancer. She agrees to continue to take  prescription Vit D @50 ,000 IU every week and will re-check labs in 2 months and will follow up for routine testing of vitamin D, at least 2-3 times per year. She was informed of the risk of over-replacement of vitamin D and agrees to not increase her dose unless he discusses this with [place] first.  Chronic Back Pain Jennifer Riley was advised that weight loss and core strengthening would help her back pain as well as massage and a note was written today for massage.  Obesity Jennifer Riley is currently in the action stage of change. As such, her goal is to continue with weight loss efforts. She has agreed to follow the Category 2 plan. Jennifer Riley has been instructed to work up to a goal of 150 minutes of combined cardio and strengthening exercise per week for weight loss and overall health benefits. We discussed the following Behavioral Modification Stratagies today: increasing lean protein intake, decreasing simple carbohydrates , increasing vegetables and increasing lower sugar fruits  Jennifer Riley has agreed to follow up with our clinic in 2 weeks. She was informed of the importance of frequent follow up visits to maximize her success with intensive lifestyle  modifications for her multiple health conditions.  I, Nevada Crane, am acting as scribe for Quillian Quince, MD  I have reviewed the above documentation for accuracy and completeness, and I agree with the above. -Quillian Quince, MD

## 2016-03-29 DIAGNOSIS — M791 Myalgia: Secondary | ICD-10-CM | POA: Diagnosis not present

## 2016-03-29 DIAGNOSIS — J029 Acute pharyngitis, unspecified: Secondary | ICD-10-CM | POA: Diagnosis not present

## 2016-04-01 ENCOUNTER — Encounter (INDEPENDENT_AMBULATORY_CARE_PROVIDER_SITE_OTHER): Payer: Self-pay

## 2016-04-01 ENCOUNTER — Ambulatory Visit (INDEPENDENT_AMBULATORY_CARE_PROVIDER_SITE_OTHER): Payer: 59 | Admitting: Family Medicine

## 2016-04-01 VITALS — BP 110/72 | Temp 97.7°F | Ht 69.0 in | Wt 216.0 lb

## 2016-04-01 DIAGNOSIS — E669 Obesity, unspecified: Secondary | ICD-10-CM

## 2016-04-01 DIAGNOSIS — E559 Vitamin D deficiency, unspecified: Secondary | ICD-10-CM | POA: Insufficient documentation

## 2016-04-01 DIAGNOSIS — Z9189 Other specified personal risk factors, not elsewhere classified: Secondary | ICD-10-CM | POA: Diagnosis not present

## 2016-04-01 DIAGNOSIS — R7303 Prediabetes: Secondary | ICD-10-CM | POA: Diagnosis not present

## 2016-04-01 DIAGNOSIS — E66811 Obesity, class 1: Secondary | ICD-10-CM

## 2016-04-01 MED ORDER — METFORMIN HCL 500 MG PO TABS: 500.0000 mg | ORAL_TABLET | Freq: Two times a day (BID) | ORAL | 0 refills | Status: DC

## 2016-04-01 MED ORDER — VITAMIN D (ERGOCALCIFEROL) 1.25 MG (50000 UNIT) PO CAPS: 50000.0000 [IU] | ORAL_CAPSULE | ORAL | 0 refills | Status: DC

## 2016-04-02 MED FILL — VIT D2 1.25 MG (50,000 UNIT: 1.25 MG | 28 days supply | Qty: 4 | Fill #0

## 2016-04-02 MED FILL — metFORMIN HCL 500 MG TABS: 500 | 30 days supply | Qty: 60 | Fill #0

## 2016-04-02 NOTE — Progress Notes (Signed)
Office: [phone]  /  Fax: [phone]   HPI:   Chief Complaint: OBESITY Jennifer LeatherwoodKatherine is here to discuss her progress with her obesity treatment plan. She is following her eating plan approximately 50 % of the time and states she is exercising 0 minutes 0 times per week. Jennifer LeatherwoodKatherine has done well maintaining her weight in the last 2 weeks. She has deviated from plan while sick. She is doing well with journaling but she is not eating enough protein. She will be doing some travelling soon and is worried about vacation weight gain. Her weight is 216 lb (98 kg) today and has maintained weight since her last visit. She has lost 7 lbs since starting treatment with us.  Pre-Diabetes Jennifer LeatherwoodKatherine has a diagnosis of prediabetes based on her elevated HgA1c and was informed this puts her at greater risk of developing diabetes. She is taking metformin currently and is doing better taking Metformin at lunch but still notes polyphagia. She continues to work on diet and exercise to decrease risk of diabetes. She denies nausea or hypoglycemia.  Vitamin D deficiency Jennifer LeatherwoodKatherine has a diagnosis of vitamin D deficiency. She is currently taking vit D and is doing well. Fatigue is improving and she denies nausea, vomiting or muscle weakness.  Wt Readings from Last 500 Encounters:  04/01/16 216 lb (98 kg)  03/14/16 216 lb (98 kg)  02/27/16 219 lb (99.3 kg)  02/13/16 223 lb (101.2 kg)     ALLERGIES: Allergies  Allergen Reactions  . Wellbutrin [Bupropion]     delusion    MEDICATIONS: Current Outpatient Prescriptions on File Prior to Visit  Medication Sig Dispense Refill  . ALPRAZolam (XANAX) 0.25 MG tablet Take 0.25 mg by mouth.    . levothyroxine (SYNTHROID, LEVOTHROID) 137 MCG tablet Take 137 mcg by mouth daily before breakfast.    . tretinoin (RETIN-A) 0.025 % cream Apply topically at bedtime.    . vortioxetine HBr (TRINTELLIX) 20 MG TABS Take 20 mg by mouth every morning.     No current  facility-administered medications on file prior to visit.     PAST MEDICAL HISTORY: Past Medical History:  Diagnosis Date  . Anxiety   . Constipation   . Depression   . Gallbladder problem   . Hypothyroidism   . Insomnia   . Joint pain   . Obesity     PAST SURGICAL HISTORY: Past Surgical History:  Procedure Laterality Date  . GALLBLADDER SURGERY     06/2001  . TUBAL LIGATION     03/19/1990    SOCIAL HISTORY: Social History  Substance Use Topics  . Smoking status: Former Smoker    Quit date: 02/12/1993  . Smokeless tobacco: Never Used  . Alcohol use Not on file    FAMILY HISTORY: Family History  Problem Relation Age of Onset  . Alcohol abuse Mother   . Cancer Father   . Alcohol abuse Father   . Kidney disease Father     ROS: Review of Systems  Constitutional: Positive for malaise/fatigue. Negative for weight loss.  Gastrointestinal: Negative for nausea and vomiting.  Musculoskeletal:       Negative muscle weakness  Endo/Heme/Allergies:       Polyphagia Negative hypoglycemia    PHYSICAL EXAM: Blood pressure 110/72, temperature 97.7 F (36.5 C), temperature source Oral, height 5\' 9"  (1.753 m), weight 216 lb (98 kg), SpO2 96 %. Body mass index is 31.9 kg/m. Physical Exam  Constitutional: She is oriented to person, place, and time. She appears  well-developed and well-nourished.  Cardiovascular: Normal rate.   Pulmonary/Chest: Effort normal.  Musculoskeletal: Normal range of motion.  Neurological: She is oriented to person, place, and time.  Skin: Skin is warm and dry.  Psychiatric: She has a normal mood and affect. Her behavior is normal.  Vitals reviewed.   RECENT LABS AND TESTS: BMET    Component Value Date/Time   NA 144 02/13/2016 0947   K 4.5 02/13/2016 0947   CL 103 02/13/2016 0947   CO2 27 02/13/2016 0947   GLUCOSE 93 02/13/2016 0947   BUN 17 02/13/2016 0947   CREATININE 1.00 02/13/2016 0947   CALCIUM 9.7 02/13/2016 0947   GFRNONAA 64  02/13/2016 0947   GFRAA 74 02/13/2016 0947   Lab Results  Component Value Date   HGBA1C 5.7 (H) 02/13/2016   Lab Results  Component Value Date   INSULIN 20.1 02/13/2016   CBC    Component Value Date/Time   WBC 7.0 02/13/2016 0947   RBC 5.06 02/13/2016 0947   HCT 44.7 02/13/2016 0947   MCV 88 02/13/2016 0947   MCH 30.0 02/13/2016 0947   MCHC 34.0 02/13/2016 0947   RDW 13.7 02/13/2016 0947   LYMPHSABS 2.1 02/13/2016 0947   EOSABS 0.1 02/13/2016 0947   BASOSABS 0.0 02/13/2016 0947   Iron/TIBC/Ferritin/ %Sat No results found for: IRON, TIBC, FERRITIN, IRONPCTSAT Lipid Panel     Component Value Date/Time   CHOL 205 (H) 02/13/2016 0947   TRIG 201 (H) 02/13/2016 0947   HDL 56 02/13/2016 0947   LDLCALC 109 (H) 02/13/2016 0947   Hepatic Function Panel     Component Value Date/Time   PROT 7.4 02/13/2016 0947   ALBUMIN 4.6 02/13/2016 0947   AST 14 02/13/2016 0947   ALT 15 02/13/2016 0947   ALKPHOS 80 02/13/2016 0947   BILITOT 0.3 02/13/2016 0947      Component Value Date/Time   TSH 7.140 (H) 02/13/2016 0947    ASSESSMENT AND PLAN: Vitamin D deficiency - Plan: Vitamin D, Ergocalciferol, (DRISDOL) 50000 units CAPS capsule  Prediabetes - Plan: metFORMIN (GLUCOPHAGE) 500 MG tablet  Obesity (BMI 30.0-34.9)  PLAN:  Pre-Diabetes Jennifer Riley will continue to work on weight loss, exercise, and decreasing simple carbohydrates in her diet to help decrease the risk of diabetes. We dicussed metformin including benefits and risks. She was informed that eating too many simple carbohydrates or too many calories at one sitting increases the likelihood of GI side effects. Jennifer Riley requested metformin for now and a prescription was written today for Metformin 500 mg 2 times a day #60 with no refills and we will re-check labs in 6 weeks. Jennifer Riley agreed to follow up with [place] as directed to monitor her progress.  Diabetes risk counselling Jennifer Riley was given extended (at least 15  minutes) diabetes prevention counseling today. She is 56 y.o. female and has risk factors for diabetes including obesity. We discussed intensive lifestyle modifications today with an emphasis on weight loss as well as increasing exercise and decreasing simple carbohydrates in her diet.   Vitamin D Deficiency Jennifer Riley was informed that low vitamin D levels contributes to fatigue and are associated with obesity, breast, and colon cancer. She agrees to continue to take prescription Vit D @50 ,000 IU every week, refill for 1 month and we will re-check labs in 6 weeks and will follow up for routine testing of vitamin D, at least 2-3 times per year. She was informed of the risk of over-replacement of vitamin D and agrees to not  increase her dose unless he discusses this with [place] first.  Obesity Jennifer Riley is currently in the action stage of change. As such, her goal is to continue with weight loss efforts She has agreed to keep a food journal with 1400 calories and 75+ grams of protein daily Jennifer Riley has been instructed to work up to a goal of 150 minutes of combined cardio and strengthening exercise per week for weight loss and overall health benefits. We discussed the following Behavioral Modification Stratagies today: increasing lean protein intake and travel strategies  Jennifer Riley has agreed to follow up with our clinic in 2 weeks. She was informed of the importance of frequent follow up visits to maximize her success with intensive lifestyle modifications for her multiple health conditions.  I, Nevada Crane, am acting as scribe for Quillian Quince, MD  I have reviewed the above documentation for accuracy and completeness, and I agree with the above. -Quillian Quince, MD

## 2016-04-17 ENCOUNTER — Ambulatory Visit (INDEPENDENT_AMBULATORY_CARE_PROVIDER_SITE_OTHER): Payer: 59 | Admitting: Family Medicine

## 2016-04-17 VITALS — BP 130/79 | Ht 69.0 in | Wt 214.0 lb

## 2016-04-17 DIAGNOSIS — Z6831 Body mass index (BMI) 31.0-31.9, adult: Secondary | ICD-10-CM

## 2016-04-17 DIAGNOSIS — E669 Obesity, unspecified: Secondary | ICD-10-CM | POA: Diagnosis not present

## 2016-04-17 DIAGNOSIS — E559 Vitamin D deficiency, unspecified: Secondary | ICD-10-CM | POA: Diagnosis not present

## 2016-04-17 DIAGNOSIS — K5900 Constipation, unspecified: Secondary | ICD-10-CM | POA: Diagnosis not present

## 2016-04-17 MED ORDER — VITAMIN D (ERGOCALCIFEROL) 1.25 MG (50000 UNIT) PO CAPS: 50000.0000 [IU] | ORAL_CAPSULE | ORAL | 0 refills | Status: DC

## 2016-04-17 MED ORDER — POLYETHYLENE GLYCOL 3350 17 GM/SCOOP PO POWD: 17.0000 g | Freq: Every day | ORAL | 1 refills | Status: AC

## 2016-04-18 MED FILL — POLYETHYLENE GLYCOL 3350 PO: 30 days supply | Qty: 527 | Fill #0

## 2016-04-18 NOTE — Progress Notes (Signed)
Office: [phone]  /  Fax: [phone]   HPI:   Chief Complaint: OBESITY Jennifer Riley is here to discuss her progress with her obesity treatment plan. She is following her eating plan approximately 80 % of the time and states she is exercising 0 minutes 0 times per week. Jennifer Riley has done well losing weight but got off track for 3 to 4 days after feeling discouraged that she hasn't lost more weight. She reports skipping lunch and being overly hungry at dinner. She notes elevated fasting blood sugars in the morning. She is ready to get back on track.  Her weight is 214 lb (97.1 kg) today and has had a weight loss of 2 pounds over a period of 2 weeks since her last visit. She has lost 9 lbs since starting treatment with [place].  Vitamin D deficiency Jennifer Riley has a diagnosis of vitamin D deficiency. She is currently taking vit D and is stable but not yet at goal. She still notes fatigue and denies nausea, vomiting or muscle weakness.  Constipation Jennifer Riley notes constipation for the last few weeks, worse since attempting weight loss. She states BM are less frequent  To 2 times per week and are now hard and painful. She denies hematochezia or melena. She is drinking 80 to 100 ounces of water per day.  Wt Readings from Last 500 Encounters:  04/17/16 214 lb (97.1 kg)  04/01/16 216 lb (98 kg)  03/14/16 216 lb (98 kg)  02/27/16 219 lb (99.3 kg)  02/13/16 223 lb (101.2 kg)     ALLERGIES: Allergies  Allergen Reactions  . Wellbutrin [Bupropion]     delusion    MEDICATIONS: Current Outpatient Prescriptions on File Prior to Visit  Medication Sig Dispense Refill  . ALPRAZolam (XANAX) 0.25 MG tablet Take 0.25 mg by mouth.    . levothyroxine (SYNTHROID, LEVOTHROID) 137 MCG tablet Take 137 mcg by mouth daily before breakfast.    . metFORMIN (GLUCOPHAGE) 500 MG tablet Take 1 tablet (500 mg total) by mouth 2 (two) times daily. 60 tablet 0  . tretinoin (RETIN-A) 0.025 % cream Apply topically at  bedtime.    . vortioxetine HBr (TRINTELLIX) 20 MG TABS Take 20 mg by mouth every morning.     No current facility-administered medications on file prior to visit.     PAST MEDICAL HISTORY: Past Medical History:  Diagnosis Date  . Anxiety   . Constipation   . Depression   . Gallbladder problem   . Hypothyroidism   . Insomnia   . Joint pain   . Obesity     PAST SURGICAL HISTORY: Past Surgical History:  Procedure Laterality Date  . GALLBLADDER SURGERY     06/2001  . TUBAL LIGATION     03/19/1990    SOCIAL HISTORY: Social History  Substance Use Topics  . Smoking status: Former Smoker    Quit date: 02/12/1993  . Smokeless tobacco: Never Used  . Alcohol use Not on file    FAMILY HISTORY: Family History  Problem Relation Age of Onset  . Alcohol abuse Mother   . Cancer Father   . Alcohol abuse Father   . Kidney disease Father     ROS: Review of Systems  Constitutional: Positive for malaise/fatigue and weight loss.  Gastrointestinal: Positive for constipation. Negative for melena, nausea and vomiting.  Musculoskeletal:       Negative muscle weakness    PHYSICAL EXAM: Blood pressure 130/79, height 5\' 9"  (1.753 m), weight 214 lb (97.1 kg). Body  mass index is 31.6 kg/m. Physical Exam  Constitutional: She is oriented to person, place, and time. She appears well-developed and well-nourished.  Cardiovascular: Normal rate.   Pulmonary/Chest: Effort normal.  Musculoskeletal: Normal range of motion.  Neurological: She is oriented to person, place, and time.  Skin: Skin is warm and dry.  Psychiatric: She has a normal mood and affect. Her behavior is normal.  Vitals reviewed.   RECENT LABS AND TESTS: BMET    Component Value Date/Time   NA 144 02/13/2016 0947   K 4.5 02/13/2016 0947   CL 103 02/13/2016 0947   CO2 27 02/13/2016 0947   GLUCOSE 93 02/13/2016 0947   BUN 17 02/13/2016 0947   CREATININE 1.00 02/13/2016 0947   CALCIUM 9.7 02/13/2016 0947   GFRNONAA 64  02/13/2016 0947   GFRAA 74 02/13/2016 0947   Lab Results  Component Value Date   HGBA1C 5.7 (H) 02/13/2016   Lab Results  Component Value Date   INSULIN 20.1 02/13/2016   CBC    Component Value Date/Time   WBC 7.0 02/13/2016 0947   RBC 5.06 02/13/2016 0947   HCT 44.7 02/13/2016 0947   MCV 88 02/13/2016 0947   MCH 30.0 02/13/2016 0947   MCHC 34.0 02/13/2016 0947   RDW 13.7 02/13/2016 0947   LYMPHSABS 2.1 02/13/2016 0947   EOSABS 0.1 02/13/2016 0947   BASOSABS 0.0 02/13/2016 0947   Iron/TIBC/Ferritin/ %Sat No results found for: IRON, TIBC, FERRITIN, IRONPCTSAT Lipid Panel     Component Value Date/Time   CHOL 205 (H) 02/13/2016 0947   TRIG 201 (H) 02/13/2016 0947   HDL 56 02/13/2016 0947   LDLCALC 109 (H) 02/13/2016 0947   Hepatic Function Panel     Component Value Date/Time   PROT 7.4 02/13/2016 0947   ALBUMIN 4.6 02/13/2016 0947   AST 14 02/13/2016 0947   ALT 15 02/13/2016 0947   ALKPHOS 80 02/13/2016 0947   BILITOT 0.3 02/13/2016 0947      Component Value Date/Time   TSH 7.140 (H) 02/13/2016 0947    ASSESSMENT AND PLAN: Constipation, unspecified constipation type - Plan: polyethylene glycol powder (GLYCOLAX/MIRALAX) powder  Vitamin D deficiency - Plan: Vitamin D, Ergocalciferol, (DRISDOL) 50000 units CAPS capsule  Class 1 obesity without serious comorbidity with body mass index (BMI) of 31.0 to 31.9 in adult, unspecified obesity type  PLAN:  Vitamin D Deficiency Jennifer Riley was informed that low vitamin D levels contributes to fatigue and are associated with obesity, breast, and colon cancer. She agrees to continue to take prescription Vit D @50 ,000 IU every week #4 with no refills and will follow up for routine testing of vitamin D, at least 2-3 times per year. She was informed of the risk of over-replacement of vitamin D and agrees to not increase her dose unless he discusses this with [place] first.  Constipation Jennifer Riley was informed decrease bowel  movement frequency is normal while losing weight, but stools should not be hard or painful. She was advised to increase her H20 intake and work on increasing her fiber intake. High fiber foods were discussed today. Jennifer Riley agrees to start to take Miralax 17 grams per day with 1 refill and will follow up in our clinic in 2 weeks.  Obesity Jennifer Riley is currently in the action stage of change. As such, her goal is to continue with weight loss efforts She has agreed to keep a food journal with 1400 calories and 75+ grams of protein daily  We reviewed importance of not  skipping meals and discussed healthy lunch options and afternoon snacks to prevent over eating at dinner.  Jennifer LeatherwoodKatherine has been instructed to work up to a goal of 150 minutes of combined cardio and strengthening exercise per week for weight loss and overall health benefits. We discussed the following Behavioral Modification Stratagies today: increasing lean protein intake, decreasing simple carbohydrates , increasing lower sugar fruits and increasing fiber rich foods and water  Jennifer LeatherwoodKatherine has agreed to follow up with our clinic in 2 weeks. She was informed of the importance of frequent follow up visits to maximize her success with intensive lifestyle modifications for her multiple health conditions.  I, [place] CraneJoanne Murray, am acting as scribe for Quillian Quincearen Beasley, MD  I have reviewed the above documentation for accuracy and completeness, and I agree with the above. -Quillian Quincearen Beasley, MD

## 2016-04-24 MED FILL — VIT D2 1.25 MG (50,000 UNIT: 1.25 MG | 28 days supply | Qty: 4 | Fill #0

## 2016-04-30 MED FILL — TRINTELLIX 20 MG TABLET: 20 | 90 days supply | Qty: 90 | Fill #0

## 2016-05-06 ENCOUNTER — Ambulatory Visit (INDEPENDENT_AMBULATORY_CARE_PROVIDER_SITE_OTHER): Payer: 59 | Admitting: Family Medicine

## 2016-05-06 VITALS — BP 111/72 | HR 61 | Temp 97.9°F | Ht 69.0 in | Wt 214.0 lb

## 2016-05-06 DIAGNOSIS — E669 Obesity, unspecified: Secondary | ICD-10-CM | POA: Diagnosis not present

## 2016-05-06 DIAGNOSIS — Z6831 Body mass index (BMI) 31.0-31.9, adult: Secondary | ICD-10-CM

## 2016-05-06 DIAGNOSIS — Z9189 Other specified personal risk factors, not elsewhere classified: Secondary | ICD-10-CM

## 2016-05-06 DIAGNOSIS — R7303 Prediabetes: Secondary | ICD-10-CM | POA: Diagnosis not present

## 2016-05-06 MED ORDER — METFORMIN HCL 500 MG PO TABS: 500.0000 mg | ORAL_TABLET | Freq: Two times a day (BID) | ORAL | 0 refills | Status: DC

## 2016-05-07 NOTE — Progress Notes (Signed)
Office: [phone]  /  Fax: [phone]   HPI:   Chief Complaint: OBESITY Jennifer Riley is here to discuss her progress with her obesity treatment plan. She is following her eating plan approximately 60 % of the time and states she is exercising 0 minutes 0 times per week. Jennifer Riley has struggled to stay on track with journaling and has noticed increased stress and [place] eating. She did well to maintain and not gain but frustrated she lost some motivation. She is ready to get back on track but feels all day long journaling gave her too much freedom. Her weight is 214 lb (97.1 kg) today and has maintained weight over a period of 2 to 3 weeks since her last visit. She has lost 9 lbs since starting treatment with us.  Pre-Diabetes Jennifer Riley has a diagnosis of prediabetes based on her last elevated Hgb A1c at 5.7 and was informed this puts her at greater risk of developing diabetes. She is stable on metformin currently and continues to work on diet and exercise to decrease risk of diabetes. She notices mild GI upset when she eats poorly while taking Metformin but not otherwise. She denies hypoglycemia.  At risk for diabetes Jennifer Riley is at higher than average risk for developing diabetes due to her obesity. She currently denies polyuria or polydipsia.   Wt Readings from Last 500 Encounters:  05/06/16 214 lb (97.1 kg)  04/17/16 214 lb (97.1 kg)  04/01/16 216 lb (98 kg)  03/14/16 216 lb (98 kg)  02/27/16 219 lb (99.3 kg)  02/13/16 223 lb (101.2 kg)     ALLERGIES: Allergies  Allergen Reactions  . Wellbutrin [Bupropion]     delusion    MEDICATIONS: Current Outpatient Prescriptions on File Prior to Visit  Medication Sig Dispense Refill  . ALPRAZolam (XANAX) 0.25 MG tablet Take 0.25 mg by mouth.    . levothyroxine (SYNTHROID, LEVOTHROID) 137 MCG tablet Take 137 mcg by mouth daily before breakfast.    . polyethylene glycol powder (GLYCOLAX/MIRALAX) powder Take 17 g by mouth daily.  500 g 1  . tretinoin (RETIN-A) 0.025 % cream Apply topically at bedtime.    . Vitamin D, Ergocalciferol, (DRISDOL) 50000 units CAPS capsule Take 1 capsule (50,000 Units total) by mouth every 7 (seven) days. 4 capsule 0  . vortioxetine HBr (TRINTELLIX) 20 MG TABS Take 20 mg by mouth every morning.     No current facility-administered medications on file prior to visit.     PAST MEDICAL HISTORY: Past Medical History:  Diagnosis Date  . Anxiety   . Constipation   . Depression   . Gallbladder problem   . Hypothyroidism   . Insomnia   . Joint pain   . Obesity     PAST SURGICAL HISTORY: Past Surgical History:  Procedure Laterality Date  . GALLBLADDER SURGERY     06/2001  . TUBAL LIGATION     03/19/1990    SOCIAL HISTORY: Social History  Substance Use Topics  . Smoking status: Former Smoker    Quit date: 02/12/1993  . Smokeless tobacco: Never Used  . Alcohol use Not on file    FAMILY HISTORY: Family History  Problem Relation Age of Onset  . Alcohol abuse Mother   . Cancer Father   . Alcohol abuse Father   . Kidney disease Father     ROS: Review of Systems  Constitutional: Negative for weight loss.  Gastrointestinal: Positive for diarrhea and nausea.  Genitourinary: Negative for frequency.  Endo/Heme/Allergies: Negative for polydipsia.  Polyphagia Negative hypoglycemia    PHYSICAL EXAM: Blood pressure 111/72, pulse 61, temperature 97.9 F (36.6 C), temperature source Oral, height 5\' 9"  (1.753 m), weight 214 lb (97.1 kg), SpO2 98 %. Body mass index is 31.6 kg/m. Physical Exam  Constitutional: She is oriented to person, place, and time. She appears well-developed and well-nourished.  Cardiovascular: Normal rate.   Pulmonary/Chest: Effort normal.  Musculoskeletal: Normal range of motion.  Neurological: She is oriented to person, place, and time.  Skin: Skin is warm and dry.  Psychiatric: She has a normal mood and affect. Her behavior is normal.  Vitals  reviewed.   RECENT LABS AND TESTS: BMET    Component Value Date/Time   NA 144 02/13/2016 0947   K 4.5 02/13/2016 0947   CL 103 02/13/2016 0947   CO2 27 02/13/2016 0947   GLUCOSE 93 02/13/2016 0947   BUN 17 02/13/2016 0947   CREATININE 1.00 02/13/2016 0947   CALCIUM 9.7 02/13/2016 0947   GFRNONAA 64 02/13/2016 0947   GFRAA 74 02/13/2016 0947   Lab Results  Component Value Date   HGBA1C 5.7 (H) 02/13/2016   Lab Results  Component Value Date   INSULIN 20.1 02/13/2016   CBC    Component Value Date/Time   WBC 7.0 02/13/2016 0947   RBC 5.06 02/13/2016 0947   HCT 44.7 02/13/2016 0947   MCV 88 02/13/2016 0947   MCH 30.0 02/13/2016 0947   MCHC 34.0 02/13/2016 0947   RDW 13.7 02/13/2016 0947   LYMPHSABS 2.1 02/13/2016 0947   EOSABS 0.1 02/13/2016 0947   BASOSABS 0.0 02/13/2016 0947   Iron/TIBC/Ferritin/ %Sat No results found for: IRON, TIBC, FERRITIN, IRONPCTSAT Lipid Panel     Component Value Date/Time   CHOL 205 (H) 02/13/2016 0947   TRIG 201 (H) 02/13/2016 0947   HDL 56 02/13/2016 0947   LDLCALC 109 (H) 02/13/2016 0947   Hepatic Function Panel     Component Value Date/Time   PROT 7.4 02/13/2016 0947   ALBUMIN 4.6 02/13/2016 0947   AST 14 02/13/2016 0947   ALT 15 02/13/2016 0947   ALKPHOS 80 02/13/2016 0947   BILITOT 0.3 02/13/2016 0947      Component Value Date/Time   TSH 7.140 (H) 02/13/2016 0947    ASSESSMENT AND PLAN: Prediabetes - Plan: metFORMIN (GLUCOPHAGE) 500 MG tablet  At risk for diabetes mellitus  Class 1 obesity without serious comorbidity with body mass index (BMI) of 31.0 to 31.9 in adult, unspecified obesity type  PLAN:  Pre-Diabetes Jennifer Leatherwood will continue to work on weight loss, exercise, and decreasing simple carbohydrates in her diet to help decrease the risk of diabetes. We dicussed metformin including benefits and risks. She was informed that eating too many simple carbohydrates or too many calories at one sitting increases the  likelihood of GI side effects. Jennifer Leatherwood agrees to continue metformin for now and a prescription was written today for 1 month refill. Jennifer Leatherwood agreed to follow up with [place] as directed to monitor her progress.  Diabetes risk counselling Jennifer Leatherwood was given extended (at least 15 minutes) diabetes prevention counseling today. She is 56 y.o. female and has risk factors for diabetes including obesity. We discussed intensive lifestyle modifications today with an emphasis on weight loss as well as increasing exercise and decreasing simple carbohydrates in her diet.  Obesity Jennifer Leatherwood is currently in the action stage of change. As such, her goal is to get back to weightloss efforts  She has agreed to keep a food journal with  400 to 550 calories and 35 grams of protein daily and follow the Category 2 plan Jennifer Leatherwood has been instructed to work up to a goal of 150 minutes of combined cardio and strengthening exercise per week for weight loss and overall health benefits. We discussed the following Behavioral Modification Stratagies today: increasing lean protein intake, decrease eating out, work on meal planning and easy cooking plans and decrease snacking   Jennifer Leatherwood has agreed to follow up with our clinic in 2 weeks. She was informed of the importance of frequent follow up visits to maximize her success with intensive lifestyle modifications for her multiple health conditions.  I, Nevada Crane, am acting as scribe for Quillian Quince, MD  I have reviewed the above documentation for accuracy and completeness, and I agree with the above. -Quillian Quince, MD

## 2016-05-17 MED FILL — metFORMIN HCL 500 MG TABS: 500 | 30 days supply | Qty: 60 | Fill #0

## 2016-05-20 ENCOUNTER — Encounter (INDEPENDENT_AMBULATORY_CARE_PROVIDER_SITE_OTHER): Payer: Self-pay | Admitting: Family Medicine

## 2016-05-21 ENCOUNTER — Encounter (INDEPENDENT_AMBULATORY_CARE_PROVIDER_SITE_OTHER): Payer: Self-pay

## 2016-05-22 ENCOUNTER — Ambulatory Visit (INDEPENDENT_AMBULATORY_CARE_PROVIDER_SITE_OTHER): Payer: 59 | Admitting: Family Medicine

## 2016-05-29 MED FILL — SYNTHROID 137 MCG TABLET: 137 | 90 days supply | Qty: 90 | Fill #0

## 2016-06-12 ENCOUNTER — Ambulatory Visit (INDEPENDENT_AMBULATORY_CARE_PROVIDER_SITE_OTHER): Payer: 59 | Admitting: Family Medicine

## 2016-06-12 VITALS — BP 121/81 | HR 58 | Temp 97.8°F | Ht 69.0 in | Wt 218.0 lb

## 2016-06-12 DIAGNOSIS — R7303 Prediabetes: Secondary | ICD-10-CM

## 2016-06-12 DIAGNOSIS — E669 Obesity, unspecified: Secondary | ICD-10-CM | POA: Diagnosis not present

## 2016-06-12 DIAGNOSIS — Z6832 Body mass index (BMI) 32.0-32.9, adult: Secondary | ICD-10-CM

## 2016-06-12 DIAGNOSIS — E559 Vitamin D deficiency, unspecified: Secondary | ICD-10-CM

## 2016-06-12 MED ORDER — VITAMIN D (ERGOCALCIFEROL) 1.25 MG (50000 UNIT) PO CAPS: 50000.0000 [IU] | ORAL_CAPSULE | ORAL | 0 refills | Status: DC

## 2016-06-12 MED ORDER — LIRAGLUTIDE -WEIGHT MANAGEMENT 18 MG/3ML ~~LOC~~ SOPN: 3.0000 mg | PEN_INJECTOR | Freq: Every day | SUBCUTANEOUS | 0 refills | Status: DC

## 2016-06-13 MED FILL — VIT D2 1.25 MG (50,000 UNIT: 1.25 MG | 28 days supply | Qty: 4 | Fill #0

## 2016-06-13 NOTE — Progress Notes (Signed)
Office: [phone]  /  Fax: [phone]   HPI:   Chief Complaint: OBESITY Jennifer Riley is here to discuss her progress with her obesity treatment plan. She is on the  follow the Category 2 plan and is following her eating plan approximately 40 % of the time. She states she is exercising 0 minutes 0 times per week. Jennifer Riley notes polyphagia has worsened in the last 2 weeks. She is having a hard time sticking to her category 2 plan. She had done well on phentermine in the past but gained weight back after stopping. Her weight is 218 lb (98.9 kg) today and has had a weight gain of 4 lbs over a period of 5 weeks since her last visit. She has lost 5 lbs since starting treatment with [place].  Vitamin D deficiency Jennifer Riley has a diagnosis of vitamin D deficiency. She is currently stable on vit D and denies nausea, vomiting or muscle weakness.  Pre-Diabetes Jennifer Riley has a diagnosis of prediabetes based on her elevated Hgb A1c and was informed this puts her at greater risk of developing diabetes. She is on metformin currently and still notes polyphagia to the point that it is difficult to decrease calorie intake. She continues to work on diet and exercise to decrease risk of diabetes. She denies nausea, vomiting or hypoglycemia.   Wt Readings from Last 500 Encounters:  06/12/16 218 lb (98.9 kg)  05/06/16 214 lb (97.1 kg)  04/17/16 214 lb (97.1 kg)  04/01/16 216 lb (98 kg)  03/14/16 216 lb (98 kg)  02/27/16 219 lb (99.3 kg)  02/13/16 223 lb (101.2 kg)     ALLERGIES: Allergies  Allergen Reactions   Wellbutrin [Bupropion]     delusion    MEDICATIONS: Current Outpatient Prescriptions on File Prior to Visit  Medication Sig Dispense Refill   ALPRAZolam (XANAX) 0.25 MG tablet Take 0.25 mg by mouth.     levothyroxine (SYNTHROID, LEVOTHROID) 137 MCG tablet Take 137 mcg by mouth daily before breakfast.     metFORMIN (GLUCOPHAGE) 500 MG tablet Take 1 tablet (500 mg total) by mouth 2 (two)  times daily. 60 tablet 0   tretinoin (RETIN-A) 0.025 % cream Apply topically at bedtime.     vortioxetine HBr (TRINTELLIX) 20 MG TABS Take 20 mg by mouth every morning.     No current facility-administered medications on file prior to visit.     PAST MEDICAL HISTORY: Past Medical History:  Diagnosis Date   Anxiety    Constipation    Depression    Gallbladder problem    Hypothyroidism    Insomnia    Joint pain    Obesity     PAST SURGICAL HISTORY: Past Surgical History:  Procedure Laterality Date   GALLBLADDER SURGERY     06/2001   TUBAL LIGATION     03/19/1990    SOCIAL HISTORY: Social History  Substance Use Topics   Smoking status: Former Smoker    Quit date: 02/12/1993   Smokeless tobacco: Never Used   Alcohol use Not on file    FAMILY HISTORY: Family History  Problem Relation Age of Onset   Alcohol abuse Mother    Cancer Father    Alcohol abuse Father    Kidney disease Father     ROS: Review of Systems  Constitutional: Negative for weight loss.  Gastrointestinal: Negative for nausea and vomiting.  Musculoskeletal:       Negative muscle weakness  Endo/Heme/Allergies:       Polyphagia Negative hypoglycemia  PHYSICAL EXAM: Blood pressure 121/81, pulse (!) 58, temperature 97.8 F (36.6 C), temperature source Oral, height 5\' 9"  (1.753 m), weight 218 lb (98.9 kg), SpO2 96 %. Body mass index is 32.19 kg/m. Physical Exam  Constitutional: She is oriented to person, place, and time. She appears well-developed and well-nourished.  Cardiovascular: Normal rate.   Pulmonary/Chest: Effort normal.  Musculoskeletal: Normal range of motion.  Neurological: She is oriented to person, place, and time.  Skin: Skin is warm and dry.  Psychiatric: She has a normal mood and affect. Her behavior is normal.  Vitals reviewed.   RECENT LABS AND TESTS: BMET    Component Value Date/Time   NA 144 02/13/2016 0947   K 4.5 02/13/2016 0947   CL 103  02/13/2016 0947   CO2 27 02/13/2016 0947   GLUCOSE 93 02/13/2016 0947   BUN 17 02/13/2016 0947   CREATININE 1.00 02/13/2016 0947   CALCIUM 9.7 02/13/2016 0947   GFRNONAA 64 02/13/2016 0947   GFRAA 74 02/13/2016 0947   Lab Results  Component Value Date   HGBA1C 5.7 (H) 02/13/2016   Lab Results  Component Value Date   INSULIN 20.1 02/13/2016   CBC    Component Value Date/Time   WBC 7.0 02/13/2016 0947   RBC 5.06 02/13/2016 0947   HCT 44.7 02/13/2016 0947   MCV 88 02/13/2016 0947   MCH 30.0 02/13/2016 0947   MCHC 34.0 02/13/2016 0947   RDW 13.7 02/13/2016 0947   LYMPHSABS 2.1 02/13/2016 0947   EOSABS 0.1 02/13/2016 0947   BASOSABS 0.0 02/13/2016 0947   Iron/TIBC/Ferritin/ %Sat No results found for: IRON, TIBC, FERRITIN, IRONPCTSAT Lipid Panel     Component Value Date/Time   CHOL 205 (H) 02/13/2016 0947   TRIG 201 (H) 02/13/2016 0947   HDL 56 02/13/2016 0947   LDLCALC 109 (H) 02/13/2016 0947   Hepatic Function Panel     Component Value Date/Time   PROT 7.4 02/13/2016 0947   ALBUMIN 4.6 02/13/2016 0947   AST 14 02/13/2016 0947   ALT 15 02/13/2016 0947   ALKPHOS 80 02/13/2016 0947   BILITOT 0.3 02/13/2016 0947      Component Value Date/Time   TSH 7.140 (H) 02/13/2016 0947    ASSESSMENT AND PLAN: Prediabetes  Vitamin D deficiency - Plan: Vitamin D, Ergocalciferol, (DRISDOL) 50000 units CAPS capsule  Class 1 obesity without serious comorbidity with body mass index (BMI) of 32.0 to 32.9 in adult, unspecified obesity type - Plan: Liraglutide -Weight Management (SAXENDA) 18 MG/3ML SOPN  PLAN:  Vitamin D Deficiency Jennifer LeatherwoodKatherine was informed that low vitamin D levels contributes to fatigue and are associated with obesity, breast, and colon cancer. She agrees to continue to take prescription Vit D @50 ,000 IU every week we will refill for 1 month and we will re-check labs in 1 month and will follow up for routine testing of vitamin D, at least 2-3 times per year. She  was informed of the risk of over-replacement of vitamin D and agrees to not increase her dose unless he discusses this with us first. Jennifer LeatherwoodKatherine agrees to follow up with our clinic in 2 weeks.  Pre-Diabetes Jennifer LeatherwoodKatherine will continue to work on weight loss, exercise, and decreasing simple carbohydrates in her diet to help decrease the risk of diabetes. We dicussed metformin including benefits and risks. She was informed that eating too many simple carbohydrates or too many calories at one sitting increases the likelihood of GI side effects. We will hold metformin for now and  a prescription was not written today. We will re-check labs in 1 month and Jennifer Riley agreed to follow up with [place] as directed to monitor her progress.  Obesity Jennifer Riley is currently in the action stage of change. As such, her goal is to continue with weight loss efforts She has agreed to follow the Category 2 plan Jennifer Riley has been instructed to work up to a goal of 150 minutes of combined cardio and strengthening exercise per week for weight loss and overall health benefits. We discussed the following Behavioral Modification Stratagies today: no skipping meals, increasing lean protein intake and work on meal planning and easy cooking plans We discussed various medication options to help Jennifer Riley with her weight loss efforts and we both agreed to start Saxenda 3.0 mg every morning #1 month with no refills (on phentermine previously) and will follow up with our clinic in 2 weeks.  Jennifer Riley has agreed to follow up with our clinic in 2 weeks. She was informed of the importance of frequent follow up visits to maximize her success with intensive lifestyle modifications for her multiple health conditions.  I, [place] Crane, am acting as scribe for Quillian Quince, MD  I have reviewed the above documentation for accuracy and completeness, and I agree with the above. -Quillian Quince, MD

## 2016-06-17 ENCOUNTER — Encounter (INDEPENDENT_AMBULATORY_CARE_PROVIDER_SITE_OTHER): Payer: Self-pay | Admitting: Family Medicine

## 2016-06-17 MED ORDER — INSULIN PEN NEEDLE 31G X 8 MM MISC: 0 refills | Status: DC

## 2016-06-21 MED FILL — UNIFINE PENTIPS 8MM 31G: 31G X 8 MM | 90 days supply | Qty: 100 | Fill #0

## 2016-06-21 MED FILL — SAXENDA 18 MG/3 ML PEN: 18 | 30 days supply | Qty: 15 | Fill #0

## 2016-07-01 ENCOUNTER — Ambulatory Visit (INDEPENDENT_AMBULATORY_CARE_PROVIDER_SITE_OTHER): Payer: 59 | Admitting: Family Medicine

## 2016-07-01 VITALS — BP 108/72 | HR 60 | Temp 97.7°F | Ht 69.0 in | Wt 212.0 lb

## 2016-07-01 DIAGNOSIS — Z9189 Other specified personal risk factors, not elsewhere classified: Secondary | ICD-10-CM | POA: Diagnosis not present

## 2016-07-01 DIAGNOSIS — E559 Vitamin D deficiency, unspecified: Secondary | ICD-10-CM | POA: Diagnosis not present

## 2016-07-01 DIAGNOSIS — E66811 Obesity, class 1: Secondary | ICD-10-CM

## 2016-07-01 DIAGNOSIS — E669 Obesity, unspecified: Secondary | ICD-10-CM | POA: Diagnosis not present

## 2016-07-01 DIAGNOSIS — R7303 Prediabetes: Secondary | ICD-10-CM

## 2016-07-01 DIAGNOSIS — Z6831 Body mass index (BMI) 31.0-31.9, adult: Secondary | ICD-10-CM | POA: Diagnosis not present

## 2016-07-01 MED ORDER — VITAMIN D (ERGOCALCIFEROL) 1.25 MG (50000 UNIT) PO CAPS: 50000.0000 [IU] | ORAL_CAPSULE | ORAL | 0 refills | Status: DC

## 2016-07-02 NOTE — Progress Notes (Signed)
Office: [phone]  /  Fax: [phone]   HPI:   Chief Complaint: OBESITY Jennifer LeatherwoodKatherine is here to discuss her progress with her obesity treatment plan. She is on the  follow the Category 2 plan and is following her eating plan approximately 95 % of the time. She states she is exercising walk-light 30 minutes 1 time per week. Jennifer LeatherwoodKatherine started [place] and notes decreased polyphagia. She continues to do  Well with weight loss but is struggling to get all her protein. Her weight is 212 lb (96.2 kg) today and has had a weight loss of 6 pounds over a period of 2 weeks since her last visit. She has lost 11 lbs since starting treatment with us.  Vitamin D deficiency Jennifer LeatherwoodKatherine has a diagnosis of vitamin D deficiency. She is currently stable on vit D and denies nausea, vomiting or muscle weakness.  Pre-Diabetes Jennifer LeatherwoodKatherine has a diagnosis of pre-diabetes based on her elevated Hgb A1c and was informed this puts her at greater risk of developing diabetes. She held  metformin after starting Saxenda and is doing very well with diet. She continues to work on diet and exercise to decrease risk of diabetes. She has decreased polyphagia and denies nausea or hypoglycemia.  At risk for diabetes Jennifer LeatherwoodKatherine is at higher than average risk for developing diabetes due to her obesity and pre-diabetes. She currently denies polyuria or polydipsia.  ALLERGIES: Allergies  Allergen Reactions   Wellbutrin [Bupropion]     delusion    MEDICATIONS: Current Outpatient Prescriptions on File Prior to Visit  Medication Sig Dispense Refill   ALPRAZolam (XANAX) 0.25 MG tablet Take 0.25 mg by mouth.     Insulin Pen Needle 31G X 8 MM MISC Use daily as directed 30 each 0   levothyroxine (SYNTHROID, LEVOTHROID) 137 MCG tablet Take 137 mcg by mouth daily before breakfast.     Liraglutide -Weight Management (SAXENDA) 18 MG/3ML SOPN Inject 3 mg into the skin daily. 6 pen 0   metFORMIN (GLUCOPHAGE) 500 MG tablet Take 1  tablet (500 mg total) by mouth 2 (two) times daily. 60 tablet 0   tretinoin (RETIN-A) 0.025 % cream Apply topically at bedtime.     vortioxetine HBr (TRINTELLIX) 20 MG TABS Take 20 mg by mouth every morning.     No current facility-administered medications on file prior to visit.     PAST MEDICAL HISTORY: Past Medical History:  Diagnosis Date   Anxiety    Constipation    Depression    Gallbladder problem    Hypothyroidism    Insomnia    Joint pain    Obesity     PAST SURGICAL HISTORY: Past Surgical History:  Procedure Laterality Date   GALLBLADDER SURGERY     06/2001   TUBAL LIGATION     03/19/1990    SOCIAL HISTORY: Social History  Substance Use Topics   Smoking status: Former Smoker    Quit date: 02/12/1993   Smokeless tobacco: Never Used   Alcohol use Not on file    FAMILY HISTORY: Family History  Problem Relation Age of Onset   Alcohol abuse Mother    Cancer Father    Alcohol abuse Father    Kidney disease Father     ROS: Review of Systems  Constitutional: Positive for weight loss.  Gastrointestinal: Negative for nausea and vomiting.  Genitourinary: Negative for frequency.  Musculoskeletal:       Negative muscle weakness  Endo/Heme/Allergies: Negative for polydipsia.       Polyphagia  Negative hypoglycemia    PHYSICAL EXAM: Blood pressure 108/72, pulse 60, temperature 97.7 F (36.5 C), temperature source Oral, height 5\' 9"  (1.753 m), weight 212 lb (96.2 kg), SpO2 97 %. Body mass index is 31.31 kg/m. Physical Exam  Constitutional: She is oriented to person, place, and time. She appears well-developed and well-nourished.  Cardiovascular: Normal rate.   Pulmonary/Chest: Effort normal.  Musculoskeletal: Normal range of motion.  Neurological: She is oriented to person, place, and time.  Skin: Skin is warm and dry.  Psychiatric: She has a normal mood and affect. Her behavior is normal.  Vitals reviewed.   RECENT LABS AND  TESTS: BMET    Component Value Date/Time   NA 144 02/13/2016 0947   K 4.5 02/13/2016 0947   CL 103 02/13/2016 0947   CO2 27 02/13/2016 0947   GLUCOSE 93 02/13/2016 0947   BUN 17 02/13/2016 0947   CREATININE 1.00 02/13/2016 0947   CALCIUM 9.7 02/13/2016 0947   GFRNONAA 64 02/13/2016 0947   GFRAA 74 02/13/2016 0947   Lab Results  Component Value Date   HGBA1C 5.7 (H) 02/13/2016   Lab Results  Component Value Date   INSULIN 20.1 02/13/2016   CBC    Component Value Date/Time   WBC 7.0 02/13/2016 0947   RBC 5.06 02/13/2016 0947   HCT 44.7 02/13/2016 0947   MCV 88 02/13/2016 0947   MCH 30.0 02/13/2016 0947   MCHC 34.0 02/13/2016 0947   RDW 13.7 02/13/2016 0947   LYMPHSABS 2.1 02/13/2016 0947   EOSABS 0.1 02/13/2016 0947   BASOSABS 0.0 02/13/2016 0947   Iron/TIBC/Ferritin/ %Sat No results found for: IRON, TIBC, FERRITIN, IRONPCTSAT Lipid Panel     Component Value Date/Time   CHOL 205 (H) 02/13/2016 0947   TRIG 201 (H) 02/13/2016 0947   HDL 56 02/13/2016 0947   LDLCALC 109 (H) 02/13/2016 0947   Hepatic Function Panel     Component Value Date/Time   PROT 7.4 02/13/2016 0947   ALBUMIN 4.6 02/13/2016 0947   AST 14 02/13/2016 0947   ALT 15 02/13/2016 0947   ALKPHOS 80 02/13/2016 0947   BILITOT 0.3 02/13/2016 0947      Component Value Date/Time   TSH 7.140 (H) 02/13/2016 0947    ASSESSMENT AND PLAN: Prediabetes  Vitamin D deficiency - Plan: Vitamin D, Ergocalciferol, (DRISDOL) 50000 units CAPS capsule  At risk for diabetes mellitus  Class 1 obesity without serious comorbidity with body mass index (BMI) of 31.0 to 31.9 in adult, unspecified obesity type  PLAN:  Vitamin D Deficiency Jennifer Riley was informed that low vitamin D levels contributes to fatigue and are associated with obesity, breast, and colon cancer. She agrees to continue to take prescription Vit D @50 ,000 IU every week, we will refill for 1 month and will re-check labs at next visit and will  follow up for routine testing of vitamin D, at least 2-3 times per year. She was informed of the risk of over-replacement of vitamin D and agrees to not increase her dose unless he discusses this with [place] first. Jennifer Riley agrees to follow up with our clinic in 2 weeks.  Pre-Diabetes Jennifer Riley will continue to work on weight loss, exercise, and decreasing simple carbohydrates in her diet to help decrease the risk of diabetes. She was informed that eating too many simple carbohydrates or too many calories at one sitting increases the likelihood of GI side effects. We will check labs at next visit and Jennifer Riley agreed to follow up with  [place] as directed to monitor her progress.  Diabetes risk counselling Jennifer Riley was given extended (at least 15 minutes) diabetes prevention counseling today. She is 56 y.o. female and has risk factors for diabetes including obesity and pre-diabetes. We discussed intensive lifestyle modifications today with an emphasis on weight loss as well as increasing exercise and decreasing simple carbohydrates in her diet.  Obesity Jennifer Riley is currently in the action stage of change. As such, her goal is to continue with weight loss efforts She has agreed to keep a food journal with 1300 to 1500 calories and 80+ grams of protein  Jennifer Riley has been instructed to work up to a goal of 150 minutes of combined cardio and strengthening exercise per week for weight loss and overall health benefits. We discussed the following Behavioral Modification Strategies today: increasing lean protein intake and no skipping meals.  Jennifer Riley has agreed to follow up with our clinic in 2 weeks. She was informed of the importance of frequent follow up visits to maximize her success with intensive lifestyle modifications for her multiple health conditions.  I, Nevada Crane, am acting as scribe for Quillian Quince, MD  I have reviewed the above documentation for accuracy and completeness, and I agree with  the above. -Quillian Quince, MD   OBESITY BEHAVIORAL INTERVENTION VISIT  Today's visit was # 8 out of 22.  Starting weight: 223 Starting date: 03/02/16 Todays weight : 212 Total lbs lost to date: 11 (Patients must lose 7 lbs in the first 6 months to continue with counseling)   ASK: We discussed the diagnosis of obesity with Jennifer Riley today and Jennifer Riley agreed to give [place] permission to discuss obesity behavioral modification therapy today.  ASSESS: Jennifer Riley has the diagnosis of obesity and her BMI today is @TBMI @ Jennifer Riley is in the action stage of change   ADVISE: Jennifer Riley was educated on the multiple health risks of obesity as well as the benefit of weight loss to improve her health. She was advised of the need for long term treatment and the importance of lifestyle modifications.  AGREE: Multiple dietary modification options and treatment options were discussed and  Jennifer Riley agreed to the above treatment plan.

## 2016-07-05 DIAGNOSIS — H5213 Myopia, bilateral: Secondary | ICD-10-CM | POA: Diagnosis not present

## 2016-07-15 ENCOUNTER — Ambulatory Visit (INDEPENDENT_AMBULATORY_CARE_PROVIDER_SITE_OTHER): Payer: 59 | Admitting: Family Medicine

## 2016-07-15 VITALS — BP 103/73 | HR 79 | Temp 97.9°F | Ht 69.0 in | Wt 209.0 lb

## 2016-07-15 DIAGNOSIS — E669 Obesity, unspecified: Secondary | ICD-10-CM | POA: Diagnosis not present

## 2016-07-15 DIAGNOSIS — Z6830 Body mass index (BMI) 30.0-30.9, adult: Secondary | ICD-10-CM | POA: Diagnosis not present

## 2016-07-15 DIAGNOSIS — R7303 Prediabetes: Secondary | ICD-10-CM | POA: Diagnosis not present

## 2016-07-15 DIAGNOSIS — E559 Vitamin D deficiency, unspecified: Secondary | ICD-10-CM | POA: Diagnosis not present

## 2016-07-15 DIAGNOSIS — E038 Other specified hypothyroidism: Secondary | ICD-10-CM | POA: Diagnosis not present

## 2016-07-15 MED ORDER — VITAMIN D (ERGOCALCIFEROL) 1.25 MG (50000 UNIT) PO CAPS: 50000.0000 [IU] | ORAL_CAPSULE | ORAL | 0 refills | Status: DC

## 2016-07-15 MED FILL — VIT D2 1.25 MG (50,000 UNIT: 1.25 MG | 28 days supply | Qty: 4 | Fill #0

## 2016-07-15 NOTE — Progress Notes (Signed)
Office: [phone]  /  Fax: [phone]   HPI:   Chief Complaint: OBESITY Jennifer Riley is here to discuss her progress with her obesity treatment plan. She is on the  follow the Category 2 plan and is following her eating plan approximately 90 % of the time. She states she is exercising 0 minutes 0 times per week. Jennifer Riley continues to do well with weight loss even with travelling this weekend. She increased her Saxenda to 1.2 and had mild nausea which has resolved. She is not exercising yet but is ready to start. Her weight is 209 lb (94.8 kg) today and has had a weight loss of 3 pounds over a period of 2 weeks since her last visit. She has lost 14 lbs since starting treatment with [place].  Vitamin D deficiency Jennifer Riley has a diagnosis of vitamin D deficiency. She is currently stable on vit D and denies nausea, vomiting or muscle weakness. Jennifer Riley is due for labs.  Pre-Diabetes Jennifer Riley has a diagnosis of pre-diabetes based on her elevated HgA1c and was informed this puts her at greater risk of developing diabetes. She is off metformin but on Saxenda and is doing well with her diet prescription. She continues to work on diet and exercise to decrease risk of diabetes. She denies nausea or hypoglycemia. Jennifer Riley is due for labs.  Hypothyroid Jennifer Riley has a diagnosis of hypothyroidism. She is on levothyroxine and has lost weight so dose may need to be adjusted.. She denies hot or cold intolerance or palpitations, but does admit to ongoing fatigue.  Hyperlipidemia Jennifer Riley has hyperlipidemia and has been attempting to control her cholesterol levels with intensive lifestyle modification including a low saturated fat diet, exercise and weight loss. She denies any chest pain, claudication or myalgias. Jennifer Riley is due for labs.  ALLERGIES: Allergies  Allergen Reactions   Wellbutrin [Bupropion]     delusion    MEDICATIONS: Current Outpatient Prescriptions on File Prior to Visit    Medication Sig Dispense Refill   ALPRAZolam (XANAX) 0.25 MG tablet Take 0.25 mg by mouth.     Insulin Pen Needle 31G X 8 MM MISC Use daily as directed 30 each 0   levothyroxine (SYNTHROID, LEVOTHROID) 137 MCG tablet Take 137 mcg by mouth daily before breakfast.     Liraglutide -Weight Management (SAXENDA) 18 MG/3ML SOPN Inject 3 mg into the skin daily. 6 pen 0   tretinoin (RETIN-A) 0.025 % cream Apply topically at bedtime.     vortioxetine HBr (TRINTELLIX) 20 MG TABS Take 20 mg by mouth every morning.     No current facility-administered medications on file prior to visit.     PAST MEDICAL HISTORY: Past Medical History:  Diagnosis Date   Anxiety    Constipation    Depression    Gallbladder problem    Hypothyroidism    Insomnia    Joint pain    Obesity     PAST SURGICAL HISTORY: Past Surgical History:  Procedure Laterality Date   GALLBLADDER SURGERY     06/2001   TUBAL LIGATION     03/19/1990    SOCIAL HISTORY: Social History  Substance Use Topics   Smoking status: Former Smoker    Quit date: 02/12/1993   Smokeless tobacco: Never Used   Alcohol use Not on file    FAMILY HISTORY: Family History  Problem Relation Age of Onset   Alcohol abuse Mother    Cancer Father    Alcohol abuse Father    Kidney disease Father  ROS: Review of Systems  Constitutional: Positive for malaise/fatigue and weight loss.  Cardiovascular: Negative for chest pain, palpitations and claudication.  Gastrointestinal: Negative for nausea and vomiting.  Musculoskeletal: Negative for myalgias.       Negative muscle weakness  Endo/Heme/Allergies:       Negative hypoglycemia Negative Heat/Cold Intolerance    PHYSICAL EXAM: Blood pressure 103/73, pulse 79, temperature 97.9 F (36.6 C), temperature source Oral, height 5\' 9"  (1.753 m), weight 209 lb (94.8 kg), SpO2 96 %. Body mass index is 30.86 kg/m. Physical Exam  Constitutional: She is oriented to person, place,  and time. She appears well-developed and well-nourished.  Cardiovascular: Normal rate.   Pulmonary/Chest: Effort normal.  Musculoskeletal: Normal range of motion.  Neurological: She is oriented to person, place, and time.  Skin: Skin is warm and dry.  Psychiatric: She has a normal mood and affect. Her behavior is normal.  Vitals reviewed.   RECENT LABS AND TESTS: BMET    Component Value Date/Time   NA 144 02/13/2016 0947   K 4.5 02/13/2016 0947   CL 103 02/13/2016 0947   CO2 27 02/13/2016 0947   GLUCOSE 93 02/13/2016 0947   BUN 17 02/13/2016 0947   CREATININE 1.00 02/13/2016 0947   CALCIUM 9.7 02/13/2016 0947   GFRNONAA 64 02/13/2016 0947   GFRAA 74 02/13/2016 0947   Lab Results  Component Value Date   HGBA1C 5.7 (H) 02/13/2016   Lab Results  Component Value Date   INSULIN 20.1 02/13/2016   CBC    Component Value Date/Time   WBC 7.0 02/13/2016 0947   RBC 5.06 02/13/2016 0947   HCT 44.7 02/13/2016 0947   MCV 88 02/13/2016 0947   MCH 30.0 02/13/2016 0947   MCHC 34.0 02/13/2016 0947   RDW 13.7 02/13/2016 0947   LYMPHSABS 2.1 02/13/2016 0947   EOSABS 0.1 02/13/2016 0947   BASOSABS 0.0 02/13/2016 0947   Iron/TIBC/Ferritin/ %Sat No results found for: IRON, TIBC, FERRITIN, IRONPCTSAT Lipid Panel     Component Value Date/Time   CHOL 205 (H) 02/13/2016 0947   TRIG 201 (H) 02/13/2016 0947   HDL 56 02/13/2016 0947   LDLCALC 109 (H) 02/13/2016 0947   Hepatic Function Panel     Component Value Date/Time   PROT 7.4 02/13/2016 0947   ALBUMIN 4.6 02/13/2016 0947   AST 14 02/13/2016 0947   ALT 15 02/13/2016 0947   ALKPHOS 80 02/13/2016 0947   BILITOT 0.3 02/13/2016 0947      Component Value Date/Time   TSH 7.140 (H) 02/13/2016 0947    ASSESSMENT AND PLAN: Vitamin D deficiency - Plan: VITAMIN D 25 Hydroxy (Vit-D Deficiency, Fractures), Vitamin D, Ergocalciferol, (DRISDOL) 50000 units CAPS capsule  Prediabetes - Plan: Comprehensive metabolic panel, Hemoglobin  A1c, Insulin, random, Lipid Panel With LDL/HDL Ratio  Other specified hypothyroidism - Plan: T3, T4, free, TSH  Class 1 obesity without serious comorbidity with body mass index (BMI) of 30.0 to 30.9 in adult, unspecified obesity type  PLAN:  Vitamin D Deficiency Jennifer Riley was informed that low vitamin D levels contributes to fatigue and are associated with obesity, breast, and colon cancer. She agrees to continue to take prescription Vit D @50 ,000 IU every week, we will refill for 1 month and will check labs and will follow up for routine testing of vitamin D, at least 2-3 times per year. She was informed of the risk of over-replacement of vitamin D and agrees to not increase her dose unless he discusses this with  [place] first. Jennifer Riley agrees to follow up with our clinic in 2 to 3 weeks.  Pre-Diabetes Jennifer Riley will continue to work on weight loss, exercise, and decreasing simple carbohydrates in her diet to help decrease the risk of diabetes. She was informed that eating too many simple carbohydrates or too many calories at one sitting increases the likelihood of GI side effects. We will check labs and Jennifer Riley agreed to follow up with [place] as directed to monitor her progress.  Hypothyroid Jennifer Riley was informed of the importance of good thyroid control to help with weight loss efforts. She was also informed that supertheraputic thyroid levels are dangerous and will not improve weight loss results. We will check labs and Jennifer Riley agrees to follow up with our clinic in 2 to 3 weeks.  Hyperlipidemia Jennifer Riley was informed of the American Heart Association Guidelines emphasizing intensive lifestyle modifications as the first line treatment for hyperlipidemia. We discussed many lifestyle modifications today in depth, and Jennifer Riley will continue to work on decreasing saturated fats such as fatty red meat, butter and many fried foods. She will also increase vegetables and lean protein in her diet and  continue to work on exercise and weight loss efforts. We will check labs and Jennifer Riley agrees to follow up with our clinic in 2 to 3 weeks.  Obesity Jennifer Riley is currently in the action stage of change. As such, her goal is to continue with weight loss efforts She has agreed to keep a food journal with 1300 to 1500 calories and 80+ grams of protein  Jennifer Riley has been instructed to work up to a goal of 150 minutes of combined cardio and strengthening exercise per week or start elliptical 5 to 10 minutes every day for weight loss and overall health benefits. We discussed the following Behavioral Modification Strategies today: avoiding temptations, keeping healthy foods in the home and planning for success. We discussed various medication options to help Jennifer Riley with her weight loss efforts and we both agreed to continue Saxenda as prescribed and follow up with our clinic in 2 to 3 weeks.  Jennifer Riley has agreed to follow up with our clinic in 2 to 3 weeks. She was informed of the importance of frequent follow up visits to maximize her success with intensive lifestyle modifications for her multiple health conditions.  I, Nevada Crane, am acting as scribe for Quillian Quince, MD  I have reviewed the above documentation for accuracy and completeness, and I agree with the above. -Quillian Quince, MD  OBESITY BEHAVIORAL INTERVENTION VISIT  Today's visit was # 9 out of 22.  Starting weight: 223 lbs Starting date: 02/13/16 Today's weight : 209 lbs Today's date: 07/15/2016 Total lbs lost to date: 14 (Patients must lose 7 lbs in the first 6 months to continue with counseling)   ASK: We discussed the diagnosis of obesity with [name] today and Jennifer Riley agreed to give [place] permission to discuss obesity behavioral modification therapy today.  ASSESS: Jennifer Riley has the diagnosis of obesity and her BMI today is 30.9 Jennifer Riley is in the action stage of change   ADVISE: Jennifer Riley was educated on  the multiple health risks of obesity as well as the benefit of weight loss to improve her health. She was advised of the need for long term treatment and the importance of lifestyle modifications.  AGREE: Multiple dietary modification options and treatment options were discussed and  Jennifer Riley agreed to keep a food journal with 1300 to 1500 calories and 80+ grams of protein  We discussed  the following Behavioral Modification Strategies today: avoiding temptations, keeping healthy foods in the home and planning for success.

## 2016-07-16 LAB — VITAMIN D 25 HYDROXY (VIT D DEFICIENCY, FRACTURES): Vit D, 25-Hydroxy: 44.1 ng/mL (ref 30.0–100.0)

## 2016-07-16 LAB — TSH: TSH: 0.808 u[IU]/mL (ref 0.450–4.500)

## 2016-07-16 LAB — LIPID PANEL WITH LDL/HDL RATIO
CHOLESTEROL TOTAL: 135 mg/dL (ref 100–199)
HDL: 38 mg/dL — AB (ref >39)
LDL Calculated: 75 mg/dL (ref 0–99)
LDL/HDL RATIO: 2 ratio (ref 0.0–3.2)
Triglycerides: 109 mg/dL (ref 0–149)
VLDL CHOLESTEROL CAL: 22 mg/dL (ref 5–40)

## 2016-07-16 LAB — COMPREHENSIVE METABOLIC PANEL
ALBUMIN: 4.2 g/dL (ref 3.5–5.5)
ALT: 15 IU/L (ref 0–32)
AST: 17 IU/L (ref 0–40)
Albumin/Globulin Ratio: 1.7 (ref 1.2–2.2)
Alkaline Phosphatase: 77 IU/L (ref 39–117)
BUN/Creatinine Ratio: 15 (ref 9–23)
BUN: 15 mg/dL (ref 6–24)
Bilirubin Total: 0.3 mg/dL (ref 0.0–1.2)
CO2: 24 mmol/L (ref 18–29)
Calcium: 9.3 mg/dL (ref 8.7–10.2)
Chloride: 106 mmol/L (ref 96–106)
Creatinine, Ser: 1 mg/dL (ref 0.57–1.00)
GFR calc non Af Amer: 64 mL/min/{1.73_m2} (ref >59)
GFR, EST AFRICAN AMERICAN: 74 mL/min/{1.73_m2} (ref >59)
Globulin, Total: 2.5 g/dL (ref 1.5–4.5)
Glucose: 90 mg/dL (ref 65–99)
Potassium: 4.7 mmol/L (ref 3.5–5.2)
Sodium: 143 mmol/L (ref 134–144)
Total Protein: 6.7 g/dL (ref 6.0–8.5)

## 2016-07-16 LAB — INSULIN, RANDOM: INSULIN: 15.6 u[IU]/mL (ref 2.6–24.9)

## 2016-07-16 LAB — HEMOGLOBIN A1C
Est. average glucose Bld gHb Est-mCnc: 114 mg/dL
Hgb A1c MFr Bld: 5.6 % (ref 4.8–5.6)

## 2016-07-16 LAB — T3: T3, Total: 89 ng/dL (ref 71–180)

## 2016-07-16 LAB — T4, FREE: Free T4: 1.68 ng/dL (ref 0.82–1.77)

## 2016-08-05 ENCOUNTER — Ambulatory Visit (INDEPENDENT_AMBULATORY_CARE_PROVIDER_SITE_OTHER): Payer: 59 | Admitting: Family Medicine

## 2016-08-05 VITALS — BP 125/76 | HR 66 | Temp 97.4°F | Ht 69.0 in | Wt 208.0 lb

## 2016-08-05 DIAGNOSIS — E559 Vitamin D deficiency, unspecified: Secondary | ICD-10-CM

## 2016-08-05 DIAGNOSIS — Z6830 Body mass index (BMI) 30.0-30.9, adult: Secondary | ICD-10-CM

## 2016-08-05 DIAGNOSIS — Z9189 Other specified personal risk factors, not elsewhere classified: Secondary | ICD-10-CM

## 2016-08-05 DIAGNOSIS — R7303 Prediabetes: Secondary | ICD-10-CM | POA: Diagnosis not present

## 2016-08-05 DIAGNOSIS — E669 Obesity, unspecified: Secondary | ICD-10-CM | POA: Diagnosis not present

## 2016-08-05 MED ORDER — VITAMIN D (ERGOCALCIFEROL) 1.25 MG (50000 UNIT) PO CAPS: 50000.0000 [IU] | ORAL_CAPSULE | ORAL | 0 refills | Status: DC

## 2016-08-06 MED FILL — ALPRAZolam 0.25 MG TABS: 0.25 | 30 days supply | Qty: 30 | Fill #0

## 2016-08-06 MED FILL — VIT D2 1.25 MG (50,000 UNIT: 1.25 MG | 28 days supply | Qty: 4 | Fill #0

## 2016-08-06 NOTE — Progress Notes (Signed)
Office: [phone]  /  Fax: [phone]   HPI:   Chief Complaint: OBESITY Jennifer Riley is here to discuss her progress with her obesity treatment plan. She is on the  follow the Category 2 plan and is following her eating plan approximately 80 % of the time. She states she is exercising elliptical 5 minutes 5 times per week. Jennifer Riley continues to do well with weight loss. She is planning ahead more and is exercising more. Her weight is 208 lb (94.3 kg) today and has had a weight loss of 1 pound over a period of 3 weeks since her last visit. She has lost 15 lbs since starting treatment with us.  Vitamin D deficiency Jennifer Riley has a diagnosis of vitamin D deficiency. She is currently taking vit D and denies nausea, vomiting or muscle weakness.  Pre-Diabetes Jennifer Riley has a diagnosis of pre-diabetes based on her elevated Hgb A1c and was informed this puts her at greater risk of developing diabetes. She is not taking metformin currently and continues to work on diet and exercise to decrease risk of diabetes. She has had increase in polyphagia and A1c improved to 5.6. She denies nausea or hypoglycemia.  At risk for diabetes Jennifer Riley is at higher than average risk for developing diabetes due to her obesity and pre-diabetes. She currently denies polyuria or polydipsia.   ALLERGIES: Allergies  Allergen Reactions  . Wellbutrin [Bupropion]     delusion    MEDICATIONS: Current Outpatient Prescriptions on File Prior to Visit  Medication Sig Dispense Refill  . ALPRAZolam (XANAX) 0.25 MG tablet Take 0.25 mg by mouth.    . Insulin Pen Needle 31G X 8 MM MISC Use daily as directed 30 each 0  . levothyroxine (SYNTHROID, LEVOTHROID) 137 MCG tablet Take 137 mcg by mouth daily before breakfast.    . Liraglutide -Weight Management (SAXENDA) 18 MG/3ML SOPN Inject 3 mg into the skin daily. (Patient taking differently: Inject 1.8 mg into the skin daily. ) 6 pen 0  . tretinoin (RETIN-A) 0.025 % cream  Apply topically at bedtime.    . vortioxetine HBr (TRINTELLIX) 20 MG TABS Take 20 mg by mouth every morning.     No current facility-administered medications on file prior to visit.     PAST MEDICAL HISTORY: Past Medical History:  Diagnosis Date  . Anxiety   . Constipation   . Depression   . Gallbladder problem   . Hypothyroidism   . Insomnia   . Joint pain   . Obesity     PAST SURGICAL HISTORY: Past Surgical History:  Procedure Laterality Date  . GALLBLADDER SURGERY     06/2001  . TUBAL LIGATION     03/19/1990    SOCIAL HISTORY: Social History  Substance Use Topics  . Smoking status: Former Smoker    Quit date: 02/12/1993  . Smokeless tobacco: Never Used  . Alcohol use Not on file    FAMILY HISTORY: Family History  Problem Relation Age of Onset  . Alcohol abuse Mother   . Cancer Father   . Alcohol abuse Father   . Kidney disease Father     ROS: Review of Systems  Constitutional: Positive for weight loss.  Gastrointestinal: Negative for nausea and vomiting.  Genitourinary: Negative for frequency.  Musculoskeletal:       Negative muscle weakness  Endo/Heme/Allergies: Negative for polydipsia.       Polyphagia Negative hypoglycemia    PHYSICAL EXAM: Blood pressure 125/76, pulse 66, temperature 97.4 F (36.3 C), temperature source  Oral, height 5\' 9"  (1.753 m), weight 208 lb (94.3 kg), SpO2 97 %. Body mass index is 30.72 kg/m. Physical Exam  Constitutional: She is oriented to person, place, and time. She appears well-developed and well-nourished.  Cardiovascular: Normal rate.   Pulmonary/Chest: Effort normal.  Musculoskeletal: Normal range of motion.  Neurological: She is oriented to person, place, and time.  Skin: Skin is warm and dry.  Psychiatric: She has a normal mood and affect. Her behavior is normal.  Vitals reviewed.   RECENT LABS AND TESTS: BMET    Component Value Date/Time   NA 143 07/15/2016 0802   K 4.7 07/15/2016 0802   CL 106  07/15/2016 0802   CO2 24 07/15/2016 0802   GLUCOSE 90 07/15/2016 0802   BUN 15 07/15/2016 0802   CREATININE 1.00 07/15/2016 0802   CALCIUM 9.3 07/15/2016 0802   GFRNONAA 64 07/15/2016 0802   GFRAA 74 07/15/2016 0802   Lab Results  Component Value Date   HGBA1C 5.6 07/15/2016   HGBA1C 5.7 (H) 02/13/2016   Lab Results  Component Value Date   INSULIN 15.6 07/15/2016   INSULIN 20.1 02/13/2016   CBC    Component Value Date/Time   WBC 7.0 02/13/2016 0947   RBC 5.06 02/13/2016 0947   HGB 15.2 02/13/2016 0947   HCT 44.7 02/13/2016 0947   MCV 88 02/13/2016 0947   MCH 30.0 02/13/2016 0947   MCHC 34.0 02/13/2016 0947   RDW 13.7 02/13/2016 0947   LYMPHSABS 2.1 02/13/2016 0947   EOSABS 0.1 02/13/2016 0947   BASOSABS 0.0 02/13/2016 0947   Iron/TIBC/Ferritin/ %Sat No results found for: IRON, TIBC, FERRITIN, IRONPCTSAT Lipid Panel     Component Value Date/Time   CHOL 135 07/15/2016 0802   TRIG 109 07/15/2016 0802   HDL 38 (L) 07/15/2016 0802   LDLCALC 75 07/15/2016 0802   Hepatic Function Panel     Component Value Date/Time   PROT 6.7 07/15/2016 0802   ALBUMIN 4.2 07/15/2016 0802   AST 17 07/15/2016 0802   ALT 15 07/15/2016 0802   ALKPHOS 77 07/15/2016 0802   BILITOT 0.3 07/15/2016 0802      Component Value Date/Time   TSH 0.808 07/15/2016 0802   TSH 7.140 (H) 02/13/2016 0947    ASSESSMENT AND PLAN: Vitamin D deficiency - Plan: Vitamin D, Ergocalciferol, (DRISDOL) 50000 units CAPS capsule  Prediabetes  At risk for diabetes mellitus  Class 1 obesity without serious comorbidity with body mass index (BMI) of 30.0 to 30.9 in adult, unspecified obesity type  PLAN:  Vitamin D Deficiency Jennifer Leatherwood was informed that low vitamin D levels contributes to fatigue and are associated with obesity, breast, and colon cancer. She agrees to continue to take prescription Vit D @50 ,000 IU every week, we will refill for 1 month and will follow up for routine testing of vitamin D,  at least 2-3 times per year. She was informed of the risk of over-replacement of vitamin D and agrees to not increase her dose unless he discusses this with [place] first. Jennifer Leatherwood agrees to follow up with our clinic in 2 to 3 weeks.  Pre-Diabetes Jennifer Leatherwood will continue to work on weight loss, exercise, and decreasing simple carbohydrates in her diet to help decrease the risk of diabetes. She was informed that eating too many simple carbohydrates or too many calories at one sitting increases the likelihood of GI side effects. Jennifer Leatherwood agreed to follow up with [place] as directed to monitor her progress.  Diabetes risk counselling Jennifer Leatherwood  was given extended (at least 15 minutes) diabetes prevention counseling today. She is 56 y.o. female and has risk factors for diabetes including obesity and pre-diabetes. We discussed intensive lifestyle modifications today with an emphasis on weight loss as well as increasing exercise and decreasing simple carbohydrates in her diet.  Obesity Jennifer Leatherwood is currently in the action stage of change. As such, her goal is to continue with weight loss efforts She has agreed to keep a food journal with 1300 to 1500 calories and 80+ grams of protein daily Jennifer Leatherwood has been instructed to work up to a goal of 150 minutes of combined cardio and strengthening exercise per week for weight loss and overall health benefits. We discussed the following Behavioral Modification Strategies today: increasing lean protein intake and emotional eating strategies We discussed various medication options to help Jennifer Leatherwood with her weight loss efforts and we both agreed to increase Saxenda to 1.8 mg once daily  Jennifer Leatherwood has agreed to follow up with our clinic in 2 to 3 weeks. She was informed of the importance of frequent follow up visits to maximize her success with intensive lifestyle modifications for her multiple health conditions.  I, [place] Crane, am acting as transcriptionist for Quillian Quince, MD  I have reviewed the above documentation for accuracy and completeness, and I agree with the above. -Quillian Quince, MD   OBESITY BEHAVIORAL INTERVENTION VISIT  Today's visit was # 10 out of 22.  Starting weight: 223 lbs Starting date: 02/13/16 Today's weight : 208 lbs  Today's date: 08/05/2016 Total lbs lost to date: 15 (Patients must lose 7 lbs in the first 6 months to continue with counseling)   ASK: We discussed the diagnosis of obesity with [name] today and Jennifer Leatherwood agreed to give [place] permission to discuss obesity behavioral modification therapy today.  ASSESS: Jennifer Leatherwood has the diagnosis of obesity and her BMI today is 30.8 Jennifer Leatherwood is in the action stage of change   ADVISE: Jennifer Leatherwood was educated on the multiple health risks of obesity as well as the benefit of weight loss to improve her health. She was advised of the need for long term treatment and the importance of lifestyle modifications.  AGREE: Multiple dietary modification options and treatment options were discussed and  Jennifer Leatherwood agreed to keep a food journal with 1300 to 1500 calories and 80+ grams of protein daily We discussed the following Behavioral Modification Strategies today: increasing lean protein intake and emotional eating strategies

## 2016-08-21 MED FILL — TRETINOIN 0.025% CREAM: 0.025 | 30 days supply | Qty: 20 | Fill #1

## 2016-08-27 ENCOUNTER — Ambulatory Visit (INDEPENDENT_AMBULATORY_CARE_PROVIDER_SITE_OTHER): Payer: 59 | Admitting: Family Medicine

## 2016-08-27 VITALS — BP 109/76 | HR 67 | Temp 97.5°F | Ht 69.0 in | Wt 208.0 lb

## 2016-08-27 DIAGNOSIS — E669 Obesity, unspecified: Secondary | ICD-10-CM

## 2016-08-27 DIAGNOSIS — Z6830 Body mass index (BMI) 30.0-30.9, adult: Secondary | ICD-10-CM

## 2016-08-27 DIAGNOSIS — E559 Vitamin D deficiency, unspecified: Secondary | ICD-10-CM

## 2016-08-27 DIAGNOSIS — Z9189 Other specified personal risk factors, not elsewhere classified: Secondary | ICD-10-CM | POA: Diagnosis not present

## 2016-08-27 MED ORDER — VITAMIN D (ERGOCALCIFEROL) 1.25 MG (50000 UNIT) PO CAPS: 50000.0000 [IU] | ORAL_CAPSULE | ORAL | 0 refills | Status: DC

## 2016-08-27 NOTE — Progress Notes (Signed)
Office: [phone]  /  Fax: [phone]   HPI:   Chief Complaint: OBESITY Jennifer Riley is here to discuss her progress with her obesity treatment plan. She is on the  keep a food journal with 1300 to 1500 calories and 80+ grams of protein  and is following her eating plan approximately 40 % of the time. She states she is exercising walking-running club for 2 to 3 times per week. Jennifer Riley has done well with maintaining weight even though she has struggled with increased emotional eating. She is doing well on Saxenda and hunger is controlled. Her weight is 208 lb (94.3 kg) today and has maintained weight over a period of 3 weeks since her last visit. She has lost 15 lbs since starting treatment with us.  Vitamin D deficiency Jennifer Riley has a diagnosis of vitamin D deficiency. She is currently stable on vit D and denies nausea, vomiting or muscle weakness.  At risk for diabetes Jennifer Riley is at higher than average risk for developing diabetes due to her obesity. She currently denies polyuria or polydipsia.   ALLERGIES: Allergies  Allergen Reactions   Wellbutrin [Bupropion]     delusion    MEDICATIONS: Current Outpatient Prescriptions on File Prior to Visit  Medication Sig Dispense Refill   ALPRAZolam (XANAX) 0.25 MG tablet Take 0.25 mg by mouth.     Insulin Pen Needle 31G X 8 MM MISC Use daily as directed 30 each 0   levothyroxine (SYNTHROID, LEVOTHROID) 137 MCG tablet Take 137 mcg by mouth daily before breakfast.     Liraglutide -Weight Management (SAXENDA) 18 MG/3ML SOPN Inject 3 mg into the skin daily. (Patient taking differently: Inject 1.8 mg into the skin daily. ) 6 pen 0   tretinoin (RETIN-A) 0.025 % cream Apply topically at bedtime.     vortioxetine HBr (TRINTELLIX) 20 MG TABS Take 20 mg by mouth every morning.     No current facility-administered medications on file prior to visit.     PAST MEDICAL HISTORY: Past Medical History:  Diagnosis Date   Anxiety     Constipation    Depression    Gallbladder problem    Hypothyroidism    Insomnia    Joint pain    Obesity     PAST SURGICAL HISTORY: Past Surgical History:  Procedure Laterality Date   GALLBLADDER SURGERY     06/2001   TUBAL LIGATION     03/19/1990    SOCIAL HISTORY: Social History  Substance Use Topics   Smoking status: Former Smoker    Quit date: 02/12/1993   Smokeless tobacco: Never Used   Alcohol use Not on file    FAMILY HISTORY: Family History  Problem Relation Age of Onset   Alcohol abuse Mother    Cancer Father    Alcohol abuse Father    Kidney disease Father     ROS: Review of Systems  Constitutional: Negative for weight loss.  Gastrointestinal: Negative for nausea and vomiting.  Genitourinary: Negative for frequency.  Musculoskeletal:       Negative muscle weakness  Endo/Heme/Allergies: Negative for polydipsia.    PHYSICAL EXAM: Blood pressure 109/76, pulse 67, temperature (!) 97.5 F (36.4 C), temperature source Oral, height 5\' 9"  (1.753 m), weight 208 lb (94.3 kg), SpO2 96 %. Body mass index is 30.72 kg/m. Physical Exam  Constitutional: She is oriented to person, place, and time. She appears well-developed and well-nourished.  Cardiovascular: Normal rate.   Pulmonary/Chest: Effort normal.  Musculoskeletal: Normal range of motion.  Neurological:  She is oriented to person, place, and time.  Skin: Skin is warm and dry.  Psychiatric: She has a normal mood and affect. Her behavior is normal.  Vitals reviewed.   RECENT LABS AND TESTS: BMET    Component Value Date/Time   NA 143 07/15/2016 0802   K 4.7 07/15/2016 0802   CL 106 07/15/2016 0802   CO2 24 07/15/2016 0802   GLUCOSE 90 07/15/2016 0802   BUN 15 07/15/2016 0802   CREATININE 1.00 07/15/2016 0802   CALCIUM 9.3 07/15/2016 0802   GFRNONAA 64 07/15/2016 0802   GFRAA 74 07/15/2016 0802   Lab Results  Component Value Date   HGBA1C 5.6 07/15/2016   HGBA1C 5.7 (H) 02/13/2016    Lab Results  Component Value Date   INSULIN 15.6 07/15/2016   INSULIN 20.1 02/13/2016   CBC    Component Value Date/Time   WBC 7.0 02/13/2016 0947   RBC 5.06 02/13/2016 0947   HGB 15.2 02/13/2016 0947   HCT 44.7 02/13/2016 0947   MCV 88 02/13/2016 0947   MCH 30.0 02/13/2016 0947   MCHC 34.0 02/13/2016 0947   RDW 13.7 02/13/2016 0947   LYMPHSABS 2.1 02/13/2016 0947   EOSABS 0.1 02/13/2016 0947   BASOSABS 0.0 02/13/2016 0947   Iron/TIBC/Ferritin/ %Sat No results found for: IRON, TIBC, FERRITIN, IRONPCTSAT Lipid Panel     Component Value Date/Time   CHOL 135 07/15/2016 0802   TRIG 109 07/15/2016 0802   HDL 38 (L) 07/15/2016 0802   LDLCALC 75 07/15/2016 0802   Hepatic Function Panel     Component Value Date/Time   PROT 6.7 07/15/2016 0802   ALBUMIN 4.2 07/15/2016 0802   AST 17 07/15/2016 0802   ALT 15 07/15/2016 0802   ALKPHOS 77 07/15/2016 0802   BILITOT 0.3 07/15/2016 0802      Component Value Date/Time   TSH 0.808 07/15/2016 0802   TSH 7.140 (H) 02/13/2016 0947    ASSESSMENT AND PLAN: Vitamin D deficiency - Plan: Vitamin D, Ergocalciferol, (DRISDOL) 50000 units CAPS capsule  At risk for diabetes mellitus  Class 1 obesity without serious comorbidity with body mass index (BMI) of 30.0 to 30.9 in adult, unspecified obesity type  PLAN:  Vitamin D Deficiency Jennifer Riley was informed that low vitamin D levels contributes to fatigue and are associated with obesity, breast, and colon cancer. She agrees to continue to take prescription Vit D @50 ,000 IU every week, we will refill for 1 month and will follow up for routine testing of vitamin D, at least 2-3 times per year. She was informed of the risk of over-replacement of vitamin D and agrees to not increase her dose unless he discusses this with [place] first. Jennifer Riley agrees to follow up with our clinic in 2 to 3 weeks.  Diabetes risk counselling Jennifer Riley was given extended (15 minutes) diabetes prevention counseling  today. She is 56 y.o. female and has risk factors for diabetes including obesity. We discussed intensive lifestyle modifications today with an emphasis on weight loss as well as increasing exercise and decreasing simple carbohydrates in her diet.  Obesity Jennifer Riley is currently in the action stage of change. As such, her goal is to continue with weight loss efforts She has agreed to keep a food journal with 1300 calories and 75+ grams of protein  Jennifer Riley has been instructed to work up to a goal of 150 minutes of combined cardio and strengthening exercise per week for weight loss and overall health benefits. We discussed the following  Behavioral Modification Strategies today: keeping healthy foods in the home, better snacking choices,  increasing lean protein intake and emotional eating strategies We discussed various medication options to help Jennifer Riley with her weight loss efforts and we both agreed to continue Saxenda 3.0, we will refill for 1 month and will refill nano needles   Jennifer Riley has agreed to follow up with our clinic in 2 to 3 weeks. She was informed of the importance of frequent follow up visits to maximize her success with intensive lifestyle modifications for her multiple health conditions.  I, [place] Crane, am acting as transcriptionist for Quillian Quince, MD  I have reviewed the above documentation for accuracy and completeness, and I agree with the above. -Quillian Quince, MD   OBESITY BEHAVIORAL INTERVENTION VISIT  Today's visit was # 11 out of 22.  Starting weight: 223 lbs Starting date: 02/13/16 Today's weight : 208 lbs  Today's date: 08/27/2016 Total lbs lost to date: 15 (Patients must lose 7 lbs in the first 6 months to continue with counseling)   ASK: We discussed the diagnosis of obesity with Jennifer Riley today and Jennifer Riley agreed to give [place] permission to discuss obesity behavioral modification therapy today.  ASSESS: Jennifer Riley has the diagnosis of  obesity and her BMI today is 30.8 Jennifer Riley is in the action stage of change   ADVISE: Jennifer Riley was educated on the multiple health risks of obesity as well as the benefit of weight loss to improve her health. She was advised of the need for long term treatment and the importance of lifestyle modifications.  AGREE: Multiple dietary modification options and treatment options were discussed and  Jennifer Riley agreed to keep a food journal with 1300 calories and 75+ grams of protein  We discussed the following Behavioral Modification Strategies today: keeping healthy foods in the home, better snacking choices, increasing lean protein intake and emotional eating strategies

## 2016-08-30 ENCOUNTER — Other Ambulatory Visit (INDEPENDENT_AMBULATORY_CARE_PROVIDER_SITE_OTHER): Payer: Self-pay | Admitting: Family Medicine

## 2016-08-30 DIAGNOSIS — Z6832 Body mass index (BMI) 32.0-32.9, adult: Principal | ICD-10-CM

## 2016-08-30 DIAGNOSIS — E669 Obesity, unspecified: Secondary | ICD-10-CM

## 2016-09-02 ENCOUNTER — Other Ambulatory Visit (INDEPENDENT_AMBULATORY_CARE_PROVIDER_SITE_OTHER): Payer: Self-pay

## 2016-09-02 ENCOUNTER — Telehealth (INDEPENDENT_AMBULATORY_CARE_PROVIDER_SITE_OTHER): Payer: Self-pay | Admitting: Family Medicine

## 2016-09-02 DIAGNOSIS — E669 Obesity, unspecified: Secondary | ICD-10-CM

## 2016-09-02 DIAGNOSIS — Z6832 Body mass index (BMI) 32.0-32.9, adult: Principal | ICD-10-CM

## 2016-09-02 MED ORDER — LIRAGLUTIDE -WEIGHT MANAGEMENT 18 MG/3ML ~~LOC~~ SOPN: 3.0000 mg | PEN_INJECTOR | Freq: Every day | SUBCUTANEOUS | 0 refills | Status: DC

## 2016-09-02 MED FILL — SAXENDA 18 MG/3 ML PEN: 18 | 30 days supply | Qty: 15 | Fill #0

## 2016-09-02 NOTE — Telephone Encounter (Signed)
[facility],  pt notified.   Janessa Mickle R CMA

## 2016-09-02 NOTE — Telephone Encounter (Signed)
Liraglutide -Weight Management (SAXENDA) 18 MG/3ML SOPN Pt was told by pharmacy insurance denied refill and was told to call office , call pt [phone]

## 2016-09-03 MED FILL — SYNTHROID 137 MCG TABLET: 137 | 90 days supply | Qty: 90 | Fill #1

## 2016-09-10 ENCOUNTER — Ambulatory Visit (INDEPENDENT_AMBULATORY_CARE_PROVIDER_SITE_OTHER): Payer: 59 | Admitting: Family Medicine

## 2016-09-10 VITALS — BP 114/79 | HR 71 | Temp 97.5°F | Ht 69.0 in | Wt 208.0 lb

## 2016-09-10 DIAGNOSIS — E669 Obesity, unspecified: Secondary | ICD-10-CM | POA: Diagnosis not present

## 2016-09-10 DIAGNOSIS — E559 Vitamin D deficiency, unspecified: Secondary | ICD-10-CM | POA: Diagnosis not present

## 2016-09-10 DIAGNOSIS — Z9189 Other specified personal risk factors, not elsewhere classified: Secondary | ICD-10-CM | POA: Diagnosis not present

## 2016-09-10 DIAGNOSIS — Z6830 Body mass index (BMI) 30.0-30.9, adult: Secondary | ICD-10-CM | POA: Diagnosis not present

## 2016-09-10 DIAGNOSIS — E038 Other specified hypothyroidism: Secondary | ICD-10-CM

## 2016-09-10 MED ORDER — VITAMIN D (ERGOCALCIFEROL) 1.25 MG (50000 UNIT) PO CAPS: 50000.0000 [IU] | ORAL_CAPSULE | ORAL | 0 refills | Status: DC

## 2016-09-10 NOTE — Progress Notes (Signed)
Office: [phone]  /  Fax: [phone]   HPI:   Chief Complaint: OBESITY Jennifer Riley is here to discuss her progress with her obesity treatment plan. She is on the  keep a food journal with 1300 calories and 75+ grams of protein  and is following her eating plan approximately 75 % of the time. She states she is exercising women's running club for 30 to 60 minutes 3 times per week. Jennifer Riley has had increase with emotional eating due to school stress and moving. Her weight is 208 lb (94.3 kg) today and has maintained weight over a period of 2 weeks since her last visit. She has lost 15 lbs since starting treatment with [place].  Vitamin D deficiency Jennifer Riley has a diagnosis of vitamin D deficiency. She is currently taking vit D and denies nausea, vomiting or muscle weakness.  Hypothyroid Jennifer Riley has a diagnosis of hypothyroidism. She is on levothyroxine. She denies hot or cold intolerance or palpitations.  At risk for osteopenia Jennifer Riley is at higher risk of osteopenia and osteoporosis due to vitamin D deficiency.   ALLERGIES: Allergies  Allergen Reactions  . Wellbutrin [Bupropion]     delusion    MEDICATIONS: Current Outpatient Prescriptions on File Prior to Visit  Medication Sig Dispense Refill  . ALPRAZolam (XANAX) 0.25 MG tablet Take 0.25 mg by mouth.    . Insulin Pen Needle 31G X 8 MM MISC Use daily as directed 30 each 0  . levothyroxine (SYNTHROID, LEVOTHROID) 137 MCG tablet Take 137 mcg by mouth daily before breakfast.    . Liraglutide -Weight Management (SAXENDA) 18 MG/3ML SOPN Inject 3 mg into the skin daily. 5 pen 0  . tretinoin (RETIN-A) 0.025 % cream Apply topically at bedtime.    . vortioxetine HBr (TRINTELLIX) 20 MG TABS Take 20 mg by mouth every morning.     No current facility-administered medications on file prior to visit.     PAST MEDICAL HISTORY: Past Medical History:  Diagnosis Date  . Anxiety   . Constipation   . Depression   . Gallbladder  problem   . Hypothyroidism   . Insomnia   . Joint pain   . Obesity     PAST SURGICAL HISTORY: Past Surgical History:  Procedure Laterality Date  . GALLBLADDER SURGERY     06/2001  . TUBAL LIGATION     03/19/1990    SOCIAL HISTORY: Social History  Substance Use Topics  . Smoking status: Former Smoker    Quit date: 02/12/1993  . Smokeless tobacco: Never Used  . Alcohol use Not on file    FAMILY HISTORY: Family History  Problem Relation Age of Onset  . Alcohol abuse Mother   . Cancer Father   . Alcohol abuse Father   . Kidney disease Father     ROS: Review of Systems  Constitutional: Negative for weight loss.  Cardiovascular: Negative for palpitations.  Gastrointestinal: Negative for nausea and vomiting.  Musculoskeletal:       Negative muscle weakness  Endo/Heme/Allergies:       Negative heat/cold intolerance    PHYSICAL EXAM: Blood pressure 114/79, pulse 71, temperature (!) 97.5 F (36.4 C), temperature source Oral, height 5\' 9"  (1.753 m), weight 208 lb (94.3 kg), SpO2 95 %. Body mass index is 30.72 kg/m. Physical Exam  Constitutional: She is oriented to person, place, and time. She appears well-developed and well-nourished.  Cardiovascular: Normal rate.   Pulmonary/Chest: Effort normal.  Musculoskeletal: Normal range of motion.  Neurological: She is oriented to  person, place, and time.  Skin: Skin is warm and dry.  Psychiatric: She has a normal mood and affect. Her behavior is normal.  Vitals reviewed.   RECENT LABS AND TESTS: BMET    Component Value Date/Time   NA 143 07/15/2016 0802   K 4.7 07/15/2016 0802   CL 106 07/15/2016 0802   CO2 24 07/15/2016 0802   GLUCOSE 90 07/15/2016 0802   BUN 15 07/15/2016 0802   CREATININE 1.00 07/15/2016 0802   CALCIUM 9.3 07/15/2016 0802   GFRNONAA 64 07/15/2016 0802   GFRAA 74 07/15/2016 0802   Lab Results  Component Value Date   HGBA1C 5.6 07/15/2016   HGBA1C 5.7 (H) 02/13/2016   Lab Results  Component  Value Date   INSULIN 15.6 07/15/2016   INSULIN 20.1 02/13/2016   CBC    Component Value Date/Time   WBC 7.0 02/13/2016 0947   RBC 5.06 02/13/2016 0947   HGB 15.2 02/13/2016 0947   HCT 44.7 02/13/2016 0947   MCV 88 02/13/2016 0947   MCH 30.0 02/13/2016 0947   MCHC 34.0 02/13/2016 0947   RDW 13.7 02/13/2016 0947   LYMPHSABS 2.1 02/13/2016 0947   EOSABS 0.1 02/13/2016 0947   BASOSABS 0.0 02/13/2016 0947   Iron/TIBC/Ferritin/ %Sat No results found for: IRON, TIBC, FERRITIN, IRONPCTSAT Lipid Panel     Component Value Date/Time   CHOL 135 07/15/2016 0802   TRIG 109 07/15/2016 0802   HDL 38 (L) 07/15/2016 0802   LDLCALC 75 07/15/2016 0802   Hepatic Function Panel     Component Value Date/Time   PROT 6.7 07/15/2016 0802   ALBUMIN 4.2 07/15/2016 0802   AST 17 07/15/2016 0802   ALT 15 07/15/2016 0802   ALKPHOS 77 07/15/2016 0802   BILITOT 0.3 07/15/2016 0802      Component Value Date/Time   TSH 0.808 07/15/2016 0802   TSH 7.140 (H) 02/13/2016 0947    ASSESSMENT AND PLAN: Vitamin D deficiency - Plan: Vitamin D, Ergocalciferol, (DRISDOL) 50000 units CAPS capsule  Other specified hypothyroidism  At risk for osteoporosis  Class 1 obesity with serious comorbidity and body mass index (BMI) of 30.0 to 30.9 in adult, unspecified obesity type  PLAN:  Vitamin D Deficiency Jennifer LeatherwoodKatherine was informed that low vitamin D levels contributes to fatigue and are associated with obesity, breast, and colon cancer. She agrees to continue to take prescription Vit D @50 ,000 IU every week, we will refill for 1 month and will follow up for routine testing of vitamin D, at least 2-3 times per year. She was informed of the risk of over-replacement of vitamin D and agrees to not increase her dose unless he discusses this with us first. Jennifer LeatherwoodKatherine   Hypothyroid Jennifer LeatherwoodKatherine was informed of the importance of good thyroid control to help with weight loss efforts. She was also informed that supertheraputic  thyroid levels are dangerous and will not improve weight loss results. She agrees to continue levothyroxine as prescribed and will follow up with our clinic in 2 to 3 weeks.  At risk for osteopenia Jennifer LeatherwoodKatherine is at risk for osteopenia and osteoporsis due to her vitamin D deficiency. She was encouraged to take her vitamin D and follow her higher calcium diet and increase strengthening exercise to help strengthen her bones and decrease her risk of osteopenia and osteoporosis.  Obesity Jennifer LeatherwoodKatherine is currently in the action stage of change. As such, her goal is to continue with weight loss efforts She has agreed to portion control better and  make smarter food choices, such as increase vegetables and decrease simple carbohydrates  Jennifer LeatherwoodKatherine has been instructed to work up to a goal of 150 minutes of combined cardio and strengthening exercise per week for weight loss and overall health benefits. We discussed the following Behavioral Modification Strategies today: increasing lean protein intake and emotional eating strategies We discussed various medication options to help Jennifer LeatherwoodKatherine with her weight loss efforts and we both agreed to increase Saxenda to 2.3 mg  Jennifer LeatherwoodKatherine has agreed to follow up with our clinic in 2 to 3 weeks. She was informed of the importance of frequent follow up visits to maximize her success with intensive lifestyle modifications for her multiple health conditions.  I, [place] CraneJoanne Murray, am acting as transcriptionist for Illa LevelSahar Osman, PA-C  I have reviewed the above documentation for accuracy and completeness, and I agree with the above. -Illa LevelSahar Osman, PA-C  I have reviewed the above note and agree with the plan. -Quillian Quincearen Anvith Mauriello, MD  OBESITY BEHAVIORAL INTERVENTION VISIT  Today's visit was # 12 out of 22.  Starting weight: 223 lbs Starting date: 02/13/16 Today's weight : 208 lbs  Today's date: 09/10/2016 Total lbs lost to date: 15 (Patients must lose 7 lbs in the first 6 months to  continue with counseling)   ASK: We discussed the diagnosis of obesity with Janeann ForehandKatherine Wyne today and Jennifer LeatherwoodKatherine agreed to give us permission to discuss obesity behavioral modification therapy today.  ASSESS: Jennifer LeatherwoodKatherine has the diagnosis of obesity and her BMI today is 30.8 Jennifer LeatherwoodKatherine is in the action stage of change   ADVISE: Jennifer LeatherwoodKatherine was educated on the multiple health risks of obesity as well as the benefit of weight loss to improve her health. She was advised of the need for long term treatment and the importance of lifestyle modifications.  AGREE: Multiple dietary modification options and treatment options were discussed and  Jennifer LeatherwoodKatherine agreed to portion control better and make smarter food choices, such as increase vegetables and decrease simple carbohydrates  We discussed the following Behavioral Modification Strategies today: increasing lean protein intake and emotional eating strategies

## 2016-09-11 MED FILL — VIT D2 1.25 MG (50,000 UNIT: 1.25 MG | 28 days supply | Qty: 4 | Fill #0

## 2016-09-16 MED FILL — TRINTELLIX 20 MG TABLET: 20 | 90 days supply | Qty: 90 | Fill #0

## 2016-09-25 DIAGNOSIS — R0602 Shortness of breath: Secondary | ICD-10-CM | POA: Diagnosis not present

## 2016-09-25 MED FILL — predniSONE 10 MG TABS: 10 | 6 days supply | Qty: 21 | Fill #0

## 2016-09-25 MED FILL — VENTOLIN HFA 90 MCG INHALER: 108 (90 BAS | 25 days supply | Qty: 18 | Fill #0

## 2016-09-30 ENCOUNTER — Ambulatory Visit (INDEPENDENT_AMBULATORY_CARE_PROVIDER_SITE_OTHER): Payer: 59 | Admitting: Physician Assistant

## 2016-09-30 VITALS — BP 103/70 | HR 73 | Temp 97.9°F | Ht 69.0 in | Wt 209.0 lb

## 2016-09-30 DIAGNOSIS — E038 Other specified hypothyroidism: Secondary | ICD-10-CM

## 2016-09-30 DIAGNOSIS — Z9189 Other specified personal risk factors, not elsewhere classified: Secondary | ICD-10-CM | POA: Diagnosis not present

## 2016-09-30 DIAGNOSIS — E669 Obesity, unspecified: Secondary | ICD-10-CM | POA: Diagnosis not present

## 2016-09-30 DIAGNOSIS — Z6830 Body mass index (BMI) 30.0-30.9, adult: Secondary | ICD-10-CM

## 2016-09-30 DIAGNOSIS — E559 Vitamin D deficiency, unspecified: Secondary | ICD-10-CM

## 2016-09-30 MED ORDER — INSULIN PEN NEEDLE 31G X 8 MM MISC: 0 refills | Status: DC

## 2016-09-30 MED ORDER — VITAMIN D (ERGOCALCIFEROL) 1.25 MG (50000 UNIT) PO CAPS: 50000.0000 [IU] | ORAL_CAPSULE | ORAL | 0 refills | Status: DC

## 2016-09-30 NOTE — Progress Notes (Addendum)
Office: [phone]  /  Fax: [phone]   HPI:   Chief Complaint: OBESITY Jennifer Riley is here to discuss her progress with her obesity treatment plan. She is on the portion control better and make smarter food choices, such as increase vegetables and decrease simple carbohydrates and is following her eating plan approximately 40 % of the time. She states she is exercising 0 minutes 0 times per week. Jennifer Riley was traveling for work and moved into a new house and so planning ahead has been challenging. She is ready to get back on a structured plan. Her weight is 209 lb (94.8 kg) today and has had a weight gain of 1 pound over a period of 3 weeks since her last visit. She has lost 14 lbs since starting treatment with [place].  Vitamin D deficiency Jennifer Riley has a diagnosis of vitamin D deficiency. She is currently taking vit D and denies nausea, vomiting or muscle weakness.  At risk for osteopenia Jennifer Riley is at higher risk of osteopenia and osteoporosis due to vitamin D deficiency.   Hypothyroid Jennifer Riley has a diagnosis of hypothyroidism. She is on synthroid. She denies hot or cold intolerance,  palpitations or fatigue.  ALLERGIES: Allergies  Allergen Reactions  . Wellbutrin [Bupropion]     delusion    MEDICATIONS: Current Outpatient Prescriptions on File Prior to Visit  Medication Sig Dispense Refill  . ALPRAZolam (XANAX) 0.25 MG tablet Take 0.25 mg by mouth.    . levothyroxine (SYNTHROID, LEVOTHROID) 137 MCG tablet Take 137 mcg by mouth daily before breakfast.    . Liraglutide -Weight Management (SAXENDA) 18 MG/3ML SOPN Inject 3 mg into the skin daily. 5 pen 0  . tretinoin (RETIN-A) 0.025 % cream Apply topically at bedtime.    . vortioxetine HBr (TRINTELLIX) 20 MG TABS Take 20 mg by mouth every morning.     No current facility-administered medications on file prior to visit.     PAST MEDICAL HISTORY: Past Medical History:  Diagnosis Date  . Anxiety   . Constipation   .  Depression   . Gallbladder problem   . Hypothyroidism   . Insomnia   . Joint pain   . Obesity     PAST SURGICAL HISTORY: Past Surgical History:  Procedure Laterality Date  . GALLBLADDER SURGERY     06/2001  . TUBAL LIGATION     03/19/1990    SOCIAL HISTORY: Social History  Substance Use Topics  . Smoking status: Former Smoker    Quit date: 02/12/1993  . Smokeless tobacco: Never Used  . Alcohol use Not on file    FAMILY HISTORY: Family History  Problem Relation Age of Onset  . Alcohol abuse Mother   . Cancer Father   . Alcohol abuse Father   . Kidney disease Father     ROS: Review of Systems  Constitutional: Negative for malaise/fatigue and weight loss.  Cardiovascular: Negative for palpitations.  Gastrointestinal: Negative for nausea and vomiting.  Musculoskeletal:       Negative muscle weakness  Endo/Heme/Allergies:       Negative Heat/Cold Intolerance    PHYSICAL EXAM: Blood pressure 103/70, pulse 73, temperature 97.9 F (36.6 C), temperature source Oral, height 5\' 9"  (1.753 m), weight 209 lb (94.8 kg), SpO2 97 %. Body mass index is 30.86 kg/m. Physical Exam  Constitutional: She is oriented to person, place, and time. She appears well-developed and well-nourished.  Cardiovascular: Normal rate.   Pulmonary/Chest: Effort normal.  Musculoskeletal: Normal range of motion.  Neurological: She is  oriented to person, place, and time.  Skin: Skin is warm and dry.  Psychiatric: She has a normal mood and affect. Her behavior is normal.  Vitals reviewed.   RECENT LABS AND TESTS: BMET    Component Value Date/Time   NA 143 07/15/2016 0802   K 4.7 07/15/2016 0802   CL 106 07/15/2016 0802   CO2 24 07/15/2016 0802   GLUCOSE 90 07/15/2016 0802   BUN 15 07/15/2016 0802   CREATININE 1.00 07/15/2016 0802   CALCIUM 9.3 07/15/2016 0802   GFRNONAA 64 07/15/2016 0802   GFRAA 74 07/15/2016 0802   Lab Results  Component Value Date   HGBA1C 5.6 07/15/2016   HGBA1C 5.7  (H) 02/13/2016   Lab Results  Component Value Date   INSULIN 15.6 07/15/2016   INSULIN 20.1 02/13/2016   CBC    Component Value Date/Time   WBC 7.0 02/13/2016 0947   RBC 5.06 02/13/2016 0947   HGB 15.2 02/13/2016 0947   HCT 44.7 02/13/2016 0947   MCV 88 02/13/2016 0947   MCH 30.0 02/13/2016 0947   MCHC 34.0 02/13/2016 0947   RDW 13.7 02/13/2016 0947   LYMPHSABS 2.1 02/13/2016 0947   EOSABS 0.1 02/13/2016 0947   BASOSABS 0.0 02/13/2016 0947   Iron/TIBC/Ferritin/ %Sat No results found for: IRON, TIBC, FERRITIN, IRONPCTSAT Lipid Panel     Component Value Date/Time   CHOL 135 07/15/2016 0802   TRIG 109 07/15/2016 0802   HDL 38 (L) 07/15/2016 0802   LDLCALC 75 07/15/2016 0802   Hepatic Function Panel     Component Value Date/Time   PROT 6.7 07/15/2016 0802   ALBUMIN 4.2 07/15/2016 0802   AST 17 07/15/2016 0802   ALT 15 07/15/2016 0802   ALKPHOS 77 07/15/2016 0802   BILITOT 0.3 07/15/2016 0802      Component Value Date/Time   TSH 0.808 07/15/2016 0802   TSH 7.140 (H) 02/13/2016 0947    ASSESSMENT AND PLAN: Vitamin D deficiency - Plan: Vitamin D, Ergocalciferol, (DRISDOL) 50000 units CAPS capsule  Other specified hypothyroidism  At risk for osteoporosis  Class 1 obesity with serious comorbidity and body mass index (BMI) of 30.0 to 30.9 in adult, unspecified obesity type - Plan: Insulin Pen Needle 31G X 8 MM MISC  PLAN:  Vitamin D Deficiency Jennifer Riley was informed that low vitamin D levels contributes to fatigue and are associated with obesity, breast, and colon cancer. She agrees to continue to take prescription Vit D @50 ,000 IU every week, we will refill for 1 month and will follow up for routine testing of vitamin D, at least 2-3 times per year. She was informed of the risk of over-replacement of vitamin D and agrees to not increase her dose unless he discusses this with [place] first. Jennifer Riley agrees to follow up with our clinic in 2  weeks.  Hypothyroid Jennifer Riley was informed of the importance of good thyroid control to help with weight loss efforts. She was also informed that supertheraputic thyroid levels are dangerous and will not improve weight loss results. Jennifer Riley agrees to continue Synthroid and will follow up with our clinic in 2 weeks.  At risk for osteopenia Jennifer Riley is at risk for osteopenia and osteoporsis due to her vitamin D deficiency. She was encouraged to take her vitamin D and follow her higher calcium diet and increase strengthening exercise to help strengthen her bones and decrease her risk of osteopenia and osteoporosis.   Obesity Jennifer Riley is currently in the action stage of change. As  such, her goal is to continue with weight loss efforts She has agreed to follow the Category 2 plan Jennifer Riley has been instructed to work up to a goal of 150 minutes of combined cardio and strengthening exercise per week for weight loss and overall health benefits. We discussed the following Behavioral Modification Strategies today: increasing lean protein intake and keeping healthy foods in the home We discussed various medication options to help Jennifer Riley with her weight loss efforts and we both agreed to continue Saxenda at 1.2 mg, we will refill nano needles.  Jennifer Riley has agreed to follow up with our clinic in 2 weeks. She was informed of the importance of frequent follow up visits to maximize her success with intensive lifestyle modifications for her multiple health conditions.  I, [place] Crane, am acting as transcriptionist for Illa Level, PA-C  I have reviewed the above documentation for accuracy and completeness, and I agree with the above. -Illa Level, PA-C     OBESITY BEHAVIORAL INTERVENTION VISIT  Today's visit was # 13 out of 22.  Starting weight: 223 lbs Starting date: 02/13/16 Today's weight : 209 lbs Today's date: 09/30/2016 Total lbs lost to date: 14 (Patients must lose 7 lbs in the  first 6 months to continue with counseling)   ASK: We discussed the diagnosis of obesity with [name] today and Jennifer Riley agreed to give [place] permission to discuss obesity behavioral modification therapy today.  ASSESS: Jennifer Riley has the diagnosis of obesity and her BMI today is 30.85 Jennifer Riley is in the action stage of change   ADVISE: Jennifer Riley was educated on the multiple health risks of obesity as well as the benefit of weight loss to improve her health. She was advised of the need for long term treatment and the importance of lifestyle modifications.  AGREE: Multiple dietary modification options and treatment options were discussed and  Jennifer Riley agreed to follow the Category 2 plan We discussed the following Behavioral Modification Strategies today: increasing lean protein intake and keeping healthy foods in the home

## 2016-10-01 MED FILL — UNIFINE PENTIPS 8MM 31G: 31G X 8 MM | 90 days supply | Qty: 100 | Fill #0

## 2016-10-03 MED FILL — VIT D2 1.25 MG (50,000 UNIT: 1.25 MG | 28 days supply | Qty: 4 | Fill #0

## 2016-10-15 ENCOUNTER — Ambulatory Visit (INDEPENDENT_AMBULATORY_CARE_PROVIDER_SITE_OTHER): Payer: 59 | Admitting: Physician Assistant

## 2016-10-22 ENCOUNTER — Encounter (INDEPENDENT_AMBULATORY_CARE_PROVIDER_SITE_OTHER): Payer: Self-pay

## 2016-10-23 ENCOUNTER — Telehealth (INDEPENDENT_AMBULATORY_CARE_PROVIDER_SITE_OTHER): Payer: Self-pay | Admitting: Family Medicine

## 2016-10-23 ENCOUNTER — Telehealth (INDEPENDENT_AMBULATORY_CARE_PROVIDER_SITE_OTHER): Payer: Self-pay | Admitting: Physician Assistant

## 2016-10-23 NOTE — Telephone Encounter (Signed)
error 

## 2016-10-23 NOTE — Telephone Encounter (Signed)
-----   Message from Lyna PoserAmy L Rogers, CMA sent at 10/22/2016  5:18 PM EDT ----- Regarding: follow up appointment Contact: [phone] I noticed patient doesn't have a follow up appt with us.   Can you check in to this?   Amy

## 2016-10-23 NOTE — Telephone Encounter (Signed)
Thank you very much 

## 2016-10-23 NOTE — Telephone Encounter (Signed)
Pt did not want to r/s her appt. She will not be coming back.

## 2016-11-12 ENCOUNTER — Encounter (INDEPENDENT_AMBULATORY_CARE_PROVIDER_SITE_OTHER): Payer: Self-pay

## 2016-11-12 ENCOUNTER — Ambulatory Visit (INDEPENDENT_AMBULATORY_CARE_PROVIDER_SITE_OTHER): Payer: 59 | Admitting: Family Medicine

## 2016-12-02 ENCOUNTER — Ambulatory Visit (INDEPENDENT_AMBULATORY_CARE_PROVIDER_SITE_OTHER): Payer: 59 | Admitting: Family Medicine

## 2016-12-02 VITALS — BP 126/86 | HR 67 | Temp 98.0°F | Ht 69.0 in | Wt 221.0 lb

## 2016-12-02 DIAGNOSIS — E669 Obesity, unspecified: Secondary | ICD-10-CM | POA: Diagnosis not present

## 2016-12-02 DIAGNOSIS — Z9189 Other specified personal risk factors, not elsewhere classified: Secondary | ICD-10-CM

## 2016-12-02 DIAGNOSIS — E559 Vitamin D deficiency, unspecified: Secondary | ICD-10-CM

## 2016-12-02 DIAGNOSIS — Z6832 Body mass index (BMI) 32.0-32.9, adult: Secondary | ICD-10-CM

## 2016-12-02 DIAGNOSIS — F3289 Other specified depressive episodes: Secondary | ICD-10-CM | POA: Diagnosis not present

## 2016-12-02 MED ORDER — VITAMIN D (ERGOCALCIFEROL) 1.25 MG (50000 UNIT) PO CAPS: 50000.0000 [IU] | ORAL_CAPSULE | ORAL | 0 refills | Status: DC

## 2016-12-02 MED ORDER — LIRAGLUTIDE -WEIGHT MANAGEMENT 18 MG/3ML ~~LOC~~ SOPN: 3.0000 mg | PEN_INJECTOR | Freq: Every day | SUBCUTANEOUS | 0 refills | Status: DC

## 2016-12-03 MED FILL — VIT D2 1.25 MG (50,000 UNIT: 1.25 MG | 28 days supply | Qty: 4 | Fill #0

## 2016-12-03 MED FILL — SAXENDA 18 MG/3 ML PEN: 18 | 30 days supply | Qty: 15 | Fill #0

## 2016-12-03 NOTE — Progress Notes (Signed)
Office: [phone]  /  Fax: [phone]   HPI:   Chief Complaint: OBESITY Jennifer Riley is here to discuss her progress with her obesity treatment plan. She is on the Category 2 plan and is following her eating plan approximately 30 % of the time. She states she is walking for 5 minutes 7 times per week. Jennifer Riley started [place] and increased dose quickly to 1.8 and felt it helped with hunger but she also felt it made her angry, so she stopped. Jennifer Riley has done increased emotional eating in the last 2 months and has gained back most of what she has lost. She states she is ready to get back on track. Her weight is 221 lb (100.2 kg) today and has had a weight gain of 12 pounds over a period of 9 weeks since her last visit. She has lost 2 lbs since starting treatment with [place].  Vitamin D deficiency Jennifer Riley has a diagnosis of vitamin D deficiency. She is currently stable on vit D and is not yet at goal. She denies nausea, vomiting or muscle weakness.  At risk for osteopenia and osteoporosis Jennifer Riley is at higher risk of osteopenia and osteoporosis due to vitamin D deficiency.   Depression with emotional eating behaviors Mahlia's mood has decreased. She has increased anger and is feeling deprived and unhappy. She is on Trintellix which has worked well in the past but she has had increased work stress. Jennifer Riley has increased using food for Barb and is feeling deprived when she tries to stop. Jennifer Riley struggles with emotional eating and using food for Yamin to the extent that it is negatively impacting her health. She often snacks when she is not hungry. Jennifer Riley sometimes feels she is out of control and then feels guilty that she made poor food choices. She has been working on behavior modification techniques to help reduce her emotional eating and has been somewhat successful. She shows no sign of suicidal or homicidal ideations.  Depression screen PHQ 2/9 02/13/2016  Decreased  Interest 3  Down, Depressed, Hopeless 3  PHQ - 2 Score 6  Altered sleeping 3  Tired, decreased energy 3  Change in appetite 3  Feeling bad or failure about yourself  2  Trouble concentrating 2  Moving slowly or fidgety/restless 2  Suicidal thoughts 1  PHQ-9 Score 22     ALLERGIES: Allergies  Allergen Reactions   Wellbutrin [Bupropion]     delusion    MEDICATIONS: Current Outpatient Prescriptions on File Prior to Visit  Medication Sig Dispense Refill   ALPRAZolam (XANAX) 0.25 MG tablet Take 0.25 mg by mouth.     Insulin Pen Needle 31G X 8 MM MISC Use daily as directed 100 each 0   levothyroxine (SYNTHROID, LEVOTHROID) 137 MCG tablet Take 137 mcg by mouth daily before breakfast.     tretinoin (RETIN-A) 0.025 % cream Apply topically at bedtime.     vortioxetine HBr (TRINTELLIX) 20 MG TABS Take 20 mg by mouth every morning.     No current facility-administered medications on file prior to visit.     PAST MEDICAL HISTORY: Past Medical History:  Diagnosis Date   Anxiety    Constipation    Depression    Gallbladder problem    Hypothyroidism    Insomnia    Joint pain    Obesity     PAST SURGICAL HISTORY: Past Surgical History:  Procedure Laterality Date   GALLBLADDER SURGERY     06/2001   TUBAL LIGATION  03/19/1990    SOCIAL HISTORY: Social History  Substance Use Topics   Smoking status: Former Smoker    Quit date: 02/12/1993   Smokeless tobacco: Never Used   Alcohol use Not on file    FAMILY HISTORY: Family History  Problem Relation Age of Onset   Alcohol abuse Mother    Cancer Father    Alcohol abuse Father    Kidney disease Father     ROS: Review of Systems  Constitutional: Negative for weight loss.  Gastrointestinal: Negative for nausea and vomiting.  Musculoskeletal:       Negative muscle weakness  Psychiatric/Behavioral: Positive for depression. Negative for suicidal ideas.    PHYSICAL EXAM: Blood pressure 126/86,  pulse 67, temperature 98 F (36.7 C), temperature source Oral, height 5\' 9"  (1.753 m), weight 221 lb (100.2 kg), SpO2 95 %. Body mass index is 32.64 kg/m. Physical Exam  Constitutional: She is oriented to person, place, and time. She appears well-developed and well-nourished.  Cardiovascular: Normal rate.   Pulmonary/Chest: Effort normal.  Musculoskeletal: Normal range of motion.  Neurological: She is oriented to person, place, and time.  Skin: Skin is warm and dry.  Psychiatric: Her affect is angry.  Vitals reviewed.   RECENT LABS AND TESTS: BMET    Component Value Date/Time   NA 143 07/15/2016 0802   K 4.7 07/15/2016 0802   CL 106 07/15/2016 0802   CO2 24 07/15/2016 0802   GLUCOSE 90 07/15/2016 0802   BUN 15 07/15/2016 0802   CREATININE 1.00 07/15/2016 0802   CALCIUM 9.3 07/15/2016 0802   GFRNONAA 64 07/15/2016 0802   GFRAA 74 07/15/2016 0802   Lab Results  Component Value Date   HGBA1C 5.6 07/15/2016   HGBA1C 5.7 (H) 02/13/2016   Lab Results  Component Value Date   INSULIN 15.6 07/15/2016   INSULIN 20.1 02/13/2016   CBC    Component Value Date/Time   WBC 7.0 02/13/2016 0947   RBC 5.06 02/13/2016 0947   HGB 15.2 02/13/2016 0947   HCT 44.7 02/13/2016 0947   MCV 88 02/13/2016 0947   MCH 30.0 02/13/2016 0947   MCHC 34.0 02/13/2016 0947   RDW 13.7 02/13/2016 0947   LYMPHSABS 2.1 02/13/2016 0947   EOSABS 0.1 02/13/2016 0947   BASOSABS 0.0 02/13/2016 0947   Iron/TIBC/Ferritin/ %Sat No results found for: IRON, TIBC, FERRITIN, IRONPCTSAT Lipid Panel     Component Value Date/Time   CHOL 135 07/15/2016 0802   TRIG 109 07/15/2016 0802   HDL 38 (L) 07/15/2016 0802   LDLCALC 75 07/15/2016 0802   Hepatic Function Panel     Component Value Date/Time   PROT 6.7 07/15/2016 0802   ALBUMIN 4.2 07/15/2016 0802   AST 17 07/15/2016 0802   ALT 15 07/15/2016 0802   ALKPHOS 77 07/15/2016 0802   BILITOT 0.3 07/15/2016 0802      Component Value Date/Time   TSH  0.808 07/15/2016 0802   TSH 7.140 (H) 02/13/2016 0947    ASSESSMENT AND PLAN: Vitamin D deficiency - Plan: Vitamin D, Ergocalciferol, (DRISDOL) 50000 units CAPS capsule  Other depression - with emotional eating  At risk for osteoporosis  Class 1 obesity with serious comorbidity and body mass index (BMI) of 32.0 to 32.9 in adult, unspecified obesity type  Class 1 obesity without serious comorbidity with body mass index (BMI) of 32.0 to 32.9 in adult, unspecified obesity type - Plan: Liraglutide -Weight Management (SAXENDA) 18 MG/3ML SOPN  PLAN:  Vitamin D Deficiency Jennifer LeatherwoodKatherine was  informed that low vitamin D levels contributes to fatigue and are associated with obesity, breast, and colon cancer. She agrees to continue to take prescription Vit D @50 ,000 IU every week #4 with no refills. We will recheck labs in 1 month and will follow up for routine testing of vitamin D, at least 2-3 times per year. She was informed of the risk of over-replacement of vitamin D and agrees to not increase her dose unless he discusses this with [place] first. Jennifer Riley agrees to follow up with our clinic in 2 weeks.  At risk for osteopenia and osteoporosis Jennifer Riley is at risk for osteopenia and osteoporosis due to her vitamin D deficiency. She was encouraged to take her vitamin D and follow her higher calcium diet and increase strengthening exercise to help strengthen her bones and decrease her risk of osteopenia and osteoporosis.  Depression with Emotional Eating Behaviors We discussed behavior modification techniques today to help Jennifer Riley deal with her emotional eating and depression. She has agreed to continue Trintellix and we discussed cognitive behavioral therapy to help recognize and work on emotional eating. Jennifer Riley agreed to follow up as directed.  Obesity Jennifer Riley is currently in the action stage of change. As such, her goal is to continue with weight loss efforts She has agreed to keep a food  journal with 1200 to 1400 calories and 75+ grams of protein  Jennifer Riley has been instructed to work up to a goal of 150 minutes of combined cardio and strengthening exercise per week for weight loss and overall health benefits. We discussed the following Behavioral Modification Strategies today: keep a strict food journal, increasing lean protein intake, decreasing simple carbohydrates, work on meal planning and easy cooking plans and emotional eating strategies  Jennifer Riley has agreed to follow up with our clinic in 2 weeks. She was informed of the importance of frequent follow up visits to maximize her success with intensive lifestyle modifications for her multiple health conditions.  I, [place] Crane, am acting as transcriptionist for Quillian Quince, MD  I have reviewed the above documentation for accuracy and completeness, and I agree with the above. -Quillian Quince, MD    OBESITY BEHAVIORAL INTERVENTION VISIT  Today's visit was # 14 out of 22.  Starting weight: 223 lbs Starting date: 02/13/16 Today's weight : 221 lbs  Today's date: 12/02/2016 Total lbs lost to date: 2 (Patients must lose 7 lbs in the first 6 months to continue with counseling)   ASK: We discussed the diagnosis of obesity with [name] today and Jennifer Riley agreed to give [place] permission to discuss obesity behavioral modification therapy today.  ASSESS: Jennifer Riley has the diagnosis of obesity and her BMI today is 32.62 Jennifer Riley is in the action stage of change   ADVISE: Jennifer Riley was educated on the multiple health risks of obesity as well as the benefit of weight loss to improve her health. She was advised of the need for long term treatment and the importance of lifestyle modifications.  AGREE: Multiple dietary modification options and treatment options were discussed and  Jennifer Riley agreed to keep a food journal with 1200 to 1400 calories and 75+ grams of protein  We discussed the following Behavioral  Modification Strategies today: keep a strict food journal, increasing lean protein intake, decreasing simple carbohydrates, work on meal planning and easy cooking plans and emotional eating strategies

## 2016-12-06 ENCOUNTER — Other Ambulatory Visit (INDEPENDENT_AMBULATORY_CARE_PROVIDER_SITE_OTHER): Payer: Self-pay | Admitting: Family Medicine

## 2016-12-06 DIAGNOSIS — E669 Obesity, unspecified: Secondary | ICD-10-CM

## 2016-12-06 DIAGNOSIS — Z6830 Body mass index (BMI) 30.0-30.9, adult: Principal | ICD-10-CM

## 2016-12-10 ENCOUNTER — Other Ambulatory Visit (INDEPENDENT_AMBULATORY_CARE_PROVIDER_SITE_OTHER): Payer: Self-pay | Admitting: Family Medicine

## 2016-12-10 DIAGNOSIS — Z6830 Body mass index (BMI) 30.0-30.9, adult: Principal | ICD-10-CM

## 2016-12-10 DIAGNOSIS — E669 Obesity, unspecified: Secondary | ICD-10-CM

## 2016-12-13 ENCOUNTER — Other Ambulatory Visit (INDEPENDENT_AMBULATORY_CARE_PROVIDER_SITE_OTHER): Payer: Self-pay | Admitting: Family Medicine

## 2016-12-13 DIAGNOSIS — Z6830 Body mass index (BMI) 30.0-30.9, adult: Principal | ICD-10-CM

## 2016-12-13 DIAGNOSIS — E669 Obesity, unspecified: Secondary | ICD-10-CM

## 2016-12-16 MED FILL — SYNTHROID 137 MCG TABLET: 137 | 90 days supply | Qty: 90 | Fill #0

## 2016-12-17 MED FILL — ALPRAZolam 0.25 MG TABS: 0.25 | 30 days supply | Qty: 30 | Fill #0

## 2016-12-18 ENCOUNTER — Ambulatory Visit (INDEPENDENT_AMBULATORY_CARE_PROVIDER_SITE_OTHER): Payer: 59 | Admitting: Family Medicine

## 2016-12-18 VITALS — BP 112/74 | HR 62 | Temp 97.9°F | Ht 69.0 in | Wt 216.0 lb

## 2016-12-18 DIAGNOSIS — E559 Vitamin D deficiency, unspecified: Secondary | ICD-10-CM | POA: Diagnosis not present

## 2016-12-18 DIAGNOSIS — Z6832 Body mass index (BMI) 32.0-32.9, adult: Secondary | ICD-10-CM

## 2016-12-18 DIAGNOSIS — E669 Obesity, unspecified: Secondary | ICD-10-CM | POA: Diagnosis not present

## 2016-12-18 MED ORDER — VITAMIN D (ERGOCALCIFEROL) 1.25 MG (50000 UNIT) PO CAPS: 50000.0000 [IU] | ORAL_CAPSULE | ORAL | 0 refills | Status: DC

## 2016-12-18 NOTE — Progress Notes (Signed)
Office: [phone]  /  Fax: [phone]   HPI:   Chief Complaint: OBESITY Jennifer Riley is here to discuss her progress with her obesity treatment plan. She is on the keep a food journal with 1200-1400 calories and 75+ grams of protein daily and is following her eating plan approximately 95 % of the time. She states she is walking 10 minutes 3 times per week. Jennifer Riley continues to do well with weight loss and is journaling most days. Her hunger is controlled overall but she is still doing some emotional eating. She is on a low dose saxenda and doing well. She is going on vacation and requests vacation eating strategies.  Her weight is 216 lb (98 kg) today and has had a weight loss of 5 pounds over a period of 2 weeks since her last visit. She has lost 7 lbs since starting treatment with [place].  Vitamin D deficiency Jennifer Riley has a diagnosis of vitamin D deficiency. She is stable on prescription Vit D, but not yet at goal. She denies nausea, vomiting or muscle weakness.  ALLERGIES: Allergies  Allergen Reactions  . Wellbutrin [Bupropion]     delusion    MEDICATIONS: Current Outpatient Medications on File Prior to Visit  Medication Sig Dispense Refill  . ALPRAZolam (XANAX) 0.25 MG tablet Take 0.25 mg by mouth.    . Insulin Pen Needle 31G X 8 MM MISC Use daily as directed 100 each 0  . levothyroxine (SYNTHROID, LEVOTHROID) 137 MCG tablet Take 137 mcg by mouth daily before breakfast.    . Liraglutide -Weight Management (SAXENDA) 18 MG/3ML SOPN Inject 3 mg into the skin daily. 5 pen 0  . tretinoin (RETIN-A) 0.025 % cream Apply topically at bedtime.    . vortioxetine HBr (TRINTELLIX) 20 MG TABS Take 20 mg by mouth every morning.     No current facility-administered medications on file prior to visit.     PAST MEDICAL HISTORY: Past Medical History:  Diagnosis Date  . Anxiety   . Constipation   . Depression   . Gallbladder problem   . Hypothyroidism   . Insomnia   . Joint pain   .  Obesity     PAST SURGICAL HISTORY: Past Surgical History:  Procedure Laterality Date  . GALLBLADDER SURGERY     06/2001  . TUBAL LIGATION     03/19/1990    SOCIAL HISTORY: Social History   Tobacco Use  . Smoking status: Former Smoker    Last attempt to quit: 02/12/1993    Years since quitting: 23.8  . Smokeless tobacco: Never Used  Substance Use Topics  . Alcohol use: Not on file  . Drug use: Not on file    FAMILY HISTORY: Family History  Problem Relation Age of Onset  . Alcohol abuse Mother   . Cancer Father   . Alcohol abuse Father   . Kidney disease Father     ROS: Review of Systems  Constitutional: Positive for weight loss.  Gastrointestinal: Negative for nausea and vomiting.  Musculoskeletal:       Negative muscle weakness    PHYSICAL EXAM: Blood pressure 112/74, pulse 62, temperature 97.9 F (36.6 C), temperature source Oral, height 5\' 9"  (1.753 m), weight 216 lb (98 kg), SpO2 98 %. Body mass index is 31.9 kg/m. Physical Exam  Constitutional: She is oriented to person, place, and time. She appears well-developed and well-nourished.  Cardiovascular: Normal rate.  Pulmonary/Chest: Effort normal.  Musculoskeletal: Normal range of motion.  Neurological: She is oriented to  person, place, and time.  Skin: Skin is warm and dry.  Psychiatric: She has a normal mood and affect. Her behavior is normal.  Vitals reviewed.   RECENT LABS AND TESTS: BMET    Component Value Date/Time   NA 143 07/15/2016 0802   K 4.7 07/15/2016 0802   CL 106 07/15/2016 0802   CO2 24 07/15/2016 0802   GLUCOSE 90 07/15/2016 0802   BUN 15 07/15/2016 0802   CREATININE 1.00 07/15/2016 0802   CALCIUM 9.3 07/15/2016 0802   GFRNONAA 64 07/15/2016 0802   GFRAA 74 07/15/2016 0802   Lab Results  Component Value Date   HGBA1C 5.6 07/15/2016   HGBA1C 5.7 (H) 02/13/2016   Lab Results  Component Value Date   INSULIN 15.6 07/15/2016   INSULIN 20.1 02/13/2016   CBC    Component  Value Date/Time   WBC 7.0 02/13/2016 0947   RBC 5.06 02/13/2016 0947   HGB 15.2 02/13/2016 0947   HCT 44.7 02/13/2016 0947   MCV 88 02/13/2016 0947   MCH 30.0 02/13/2016 0947   MCHC 34.0 02/13/2016 0947   RDW 13.7 02/13/2016 0947   LYMPHSABS 2.1 02/13/2016 0947   EOSABS 0.1 02/13/2016 0947   BASOSABS 0.0 02/13/2016 0947   Iron/TIBC/Ferritin/ %Sat No results found for: IRON, TIBC, FERRITIN, IRONPCTSAT Lipid Panel     Component Value Date/Time   CHOL 135 07/15/2016 0802   TRIG 109 07/15/2016 0802   HDL 38 (L) 07/15/2016 0802   LDLCALC 75 07/15/2016 0802   Hepatic Function Panel     Component Value Date/Time   PROT 6.7 07/15/2016 0802   ALBUMIN 4.2 07/15/2016 0802   AST 17 07/15/2016 0802   ALT 15 07/15/2016 0802   ALKPHOS 77 07/15/2016 0802   BILITOT 0.3 07/15/2016 0802      Component Value Date/Time   TSH 0.808 07/15/2016 0802   TSH 7.140 (H) 02/13/2016 0947    ASSESSMENT AND PLAN: Vitamin D deficiency - Plan: Vitamin D, Ergocalciferol, (DRISDOL) 50000 units CAPS capsule  Class 1 obesity with serious comorbidity and body mass index (BMI) of 32.0 to 32.9 in adult, unspecified obesity type  PLAN:  Vitamin D Deficiency Jennifer LeatherwoodKatherine was informed that low vitamin D levels contributes to fatigue and are associated with obesity, breast, and colon cancer. Jennifer LeatherwoodKatherine agrees to continue taking prescription Vit D @50 ,000 IU every week #4 and we will refill for 1 month. She will follow up for routine testing of vitamin D, at least 2-3 times per year. She was informed of the risk of over-replacement of vitamin D and agrees to not increase her dose unless he discusses this with us first. Jennifer LeatherwoodKatherine agrees to follow up with our clinic in 4 to 5 weeks.  Obesity Jennifer LeatherwoodKatherine is currently in the action stage of change. As such, her goal is to continue with weight loss efforts She has agreed to keep a food journal with 1200-1400 calories and 75+ grams of protein daily Jennifer LeatherwoodKatherine has been  instructed to work up to a goal of 150 minutes of combined cardio and strengthening exercise per week for weight loss and overall health benefits. We discussed the following Behavioral Modification Strategies today: increasing lean protein intake, decreasing simple carbohydrates, work on meal planning and easy cooking plans, holiday eating strategies, travel eating strategies, and celebration eating strategies.  We discussed various medication options to help Jennifer LeatherwoodKatherine with her weight loss efforts and we both agreed to increase saxenda to 0.9 mg qd (no refills needed) and will follow  up with our clinic in 4 to 5 weeks.   Jennifer LeatherwoodKatherine has agreed to follow up with our clinic in 4 to 5 weeks. She was informed of the importance of frequent follow up visits to maximize her success with intensive lifestyle modifications for her multiple health conditions.  I, Burt KnackSharon Shyne Resch, am acting as transcriptionist for Quillian Quincearen Beasley, MD  I have reviewed the above documentation for accuracy and completeness, and I agree with the above. -Quillian Quincearen Beasley, MD      Today's visit was # 15 out of 22.  Starting weight: 223 lbs Starting date: 02/13/16 Today's weight : 216 lbs  Today's date: 12/18/2016 Total lbs lost to date: 7 (Patients must lose 7 lbs in the first 6 months to continue with counseling)   ASK: We discussed the diagnosis of obesity with Jennifer ForehandKatherine Riley today and Jennifer LeatherwoodKatherine agreed to give us permission to discuss obesity behavioral modification therapy today.  ASSESS: Jennifer LeatherwoodKatherine has the diagnosis of obesity and her BMI today is 31.88 Jennifer LeatherwoodKatherine is in the action stage of change   ADVISE: Jennifer LeatherwoodKatherine was educated on the multiple health risks of obesity as well as the benefit of weight loss to improve her health. She was advised of the need for long term treatment and the importance of lifestyle modifications.  AGREE: Multiple dietary modification options and treatment options were discussed and  Jennifer LeatherwoodKatherine  agreed to keep a food journal with 1200-1400 calories and 75+ grams of protein daily We discussed the following Behavioral Modification Strategies today: increasing lean protein intake, decreasing simple carbohydrates, work on meal planning and easy cooking plans, holiday eating strategies, travel eating strategies, and celebration eating strategies.

## 2017-01-14 ENCOUNTER — Encounter (INDEPENDENT_AMBULATORY_CARE_PROVIDER_SITE_OTHER): Payer: Self-pay

## 2017-01-14 ENCOUNTER — Ambulatory Visit (INDEPENDENT_AMBULATORY_CARE_PROVIDER_SITE_OTHER): Payer: 59 | Admitting: Family Medicine

## 2017-01-27 MED FILL — TRINTELLIX 20 MG TABLET: 20 | 30 days supply | Qty: 30 | Fill #0

## 2017-02-20 DIAGNOSIS — F339 Major depressive disorder, recurrent, unspecified: Secondary | ICD-10-CM | POA: Diagnosis not present

## 2017-02-20 DIAGNOSIS — E559 Vitamin D deficiency, unspecified: Secondary | ICD-10-CM | POA: Diagnosis not present

## 2017-02-20 DIAGNOSIS — Z6835 Body mass index (BMI) 35.0-35.9, adult: Secondary | ICD-10-CM | POA: Diagnosis not present

## 2017-02-20 DIAGNOSIS — E039 Hypothyroidism, unspecified: Secondary | ICD-10-CM | POA: Diagnosis not present

## 2017-02-20 DIAGNOSIS — E782 Mixed hyperlipidemia: Secondary | ICD-10-CM | POA: Diagnosis not present

## 2017-02-20 DIAGNOSIS — G479 Sleep disorder, unspecified: Secondary | ICD-10-CM | POA: Diagnosis not present

## 2017-02-20 DIAGNOSIS — F419 Anxiety disorder, unspecified: Secondary | ICD-10-CM | POA: Diagnosis not present

## 2017-02-20 DIAGNOSIS — R7303 Prediabetes: Secondary | ICD-10-CM | POA: Diagnosis not present

## 2017-02-24 MED FILL — SYNTHROID 150 MCG TABLET: 150 | 30 days supply | Qty: 30 | Fill #0

## 2017-03-05 MED FILL — TRINTELLIX 20 MG TABLET: 20 | 30 days supply | Qty: 30 | Fill #0

## 2017-03-12 ENCOUNTER — Telehealth (INDEPENDENT_AMBULATORY_CARE_PROVIDER_SITE_OTHER): Payer: Self-pay | Admitting: Family Medicine

## 2017-03-12 NOTE — Telephone Encounter (Signed)
Patient scheduled to see Dr. Dalbert GarnetBeasley 03/26/17 at 5:20.

## 2017-03-12 NOTE — Telephone Encounter (Signed)
Spoke with the patient and informed her Dr Dalbert GarnetBeasley is able to write phentermine but that is a decision that is made based off the need of the patient. Patient requested to be transferred to make a appt and when transferred the patient hung the phone up. April, CMA

## 2017-03-12 NOTE — Telephone Encounter (Signed)
Jennifer LeatherwoodKatherine called to see Dr. Dalbert GarnetBeasley can prescribe phentermine.  Patient was last seen in November, cancelled her Dec appt and has not rescheduled. Thank you.

## 2017-03-26 ENCOUNTER — Ambulatory Visit (INDEPENDENT_AMBULATORY_CARE_PROVIDER_SITE_OTHER): Payer: 59 | Admitting: Family Medicine

## 2017-03-26 VITALS — BP 125/82 | HR 59 | Temp 97.4°F | Ht 69.0 in | Wt 233.0 lb

## 2017-03-26 DIAGNOSIS — E669 Obesity, unspecified: Secondary | ICD-10-CM

## 2017-03-26 DIAGNOSIS — E559 Vitamin D deficiency, unspecified: Secondary | ICD-10-CM

## 2017-03-26 DIAGNOSIS — Z9189 Other specified personal risk factors, not elsewhere classified: Secondary | ICD-10-CM | POA: Diagnosis not present

## 2017-03-26 DIAGNOSIS — Z6834 Body mass index (BMI) 34.0-34.9, adult: Secondary | ICD-10-CM | POA: Diagnosis not present

## 2017-03-26 DIAGNOSIS — R9431 Abnormal electrocardiogram [ECG] [EKG]: Secondary | ICD-10-CM

## 2017-03-26 MED ORDER — PHENTERMINE HCL 15 MG PO CAPS: 15.0000 mg | ORAL_CAPSULE | ORAL | 0 refills | Status: DC

## 2017-03-26 MED ORDER — VITAMIN D (ERGOCALCIFEROL) 1.25 MG (50000 UNIT) PO CAPS: 50000.0000 [IU] | ORAL_CAPSULE | ORAL | 0 refills | Status: DC

## 2017-03-27 MED FILL — VIT D2 1.25 MG (50,000 UNIT: 1.25 MG | 28 days supply | Qty: 4 | Fill #0

## 2017-03-27 MED FILL — PHENTERMINE 15 MG CAPSULE: 15 | 30 days supply | Qty: 30 | Fill #0

## 2017-03-27 NOTE — Progress Notes (Signed)
Office: [phone]  /  Fax: [phone]   HPI:   Chief Complaint: OBESITY Jennifer LeatherwoodKatherine is here to discuss her progress with her obesity treatment plan. She is on the keep a food journal with 1200 to 1400 calories and 75+ grams of protein daily and is following her eating plan approximately 10 % of the time. She states she is exercising 0 minutes 0 times per week. Katherines last visit was approximately 3 months ago. She has gotten off track and actually gained weight to try to qualify for weight loss surgery. Her BMI was > 35, but she didn't have qualifying comorbidities. She is ready to try medical weight loss again, but requests to take phentermine as she has done in the past. Her weight is 233 lb (105.7 kg) today and has had a weight gain of 17 pounds over a period of 14 weeks since her last visit. She has lost 10 lbs since starting treatment with us.  Vitamin D deficiency Jennifer LeatherwoodKatherine has a diagnosis of vitamin D deficiency and is not yet at goal.. Jennifer LeatherwoodKatherine has not been on vitamin D recently and she denies nausea, vomiting or muscle weakness.   Ref. Range 07/15/2016 08:02  Vitamin D, 25-Hydroxy Latest Ref Range: 30.0 - 100.0 ng/mL 44.1   Abnormal EKG Katherines last EKG showed nonspecific T wave. Jennifer LeatherwoodKatherine would like to restart phentermine and was informed, we need to have a normal EKG before we can restart this medication. She denies chest pain or lightheadedness.  At risk for cardiovascular disease Jennifer LeatherwoodKatherine is at a higher than average risk for cardiovascular disease due to obesity. She currently denies any chest pain.  ALLERGIES: Allergies  Allergen Reactions  . Wellbutrin [Bupropion]     delusion    MEDICATIONS: Current Outpatient Medications on File Prior to Visit  Medication Sig Dispense Refill  . ALPRAZolam (XANAX) 0.25 MG tablet Take 0.25 mg by mouth.    . Insulin Pen Needle 31G X 8 MM MISC Use daily as directed 100 each 0  . levothyroxine (SYNTHROID, LEVOTHROID) 137 MCG  tablet Take 137 mcg by mouth daily before breakfast.    . tretinoin (RETIN-A) 0.025 % cream Apply topically at bedtime.    . vortioxetine HBr (TRINTELLIX) 20 MG TABS Take 20 mg by mouth every morning.     No current facility-administered medications on file prior to visit.     PAST MEDICAL HISTORY: Past Medical History:  Diagnosis Date  . Anxiety   . Constipation   . Depression   . Gallbladder problem   . Hypothyroidism   . Insomnia   . Joint pain   . Obesity     PAST SURGICAL HISTORY: Past Surgical History:  Procedure Laterality Date  . GALLBLADDER SURGERY     06/2001  . TUBAL LIGATION     03/19/1990    SOCIAL HISTORY: Social History   Tobacco Use  . Smoking status: Former Smoker    Last attempt to quit: 02/12/1993    Years since quitting: 24.1  . Smokeless tobacco: Never Used  Substance Use Topics  . Alcohol use: Not on file  . Drug use: Not on file    FAMILY HISTORY: Family History  Problem Relation Age of Onset  . Alcohol abuse Mother   . Cancer Father   . Alcohol abuse Father   . Kidney disease Father     ROS: Review of Systems  Constitutional: Negative for weight loss.  Cardiovascular: Negative for chest pain.  Gastrointestinal: Negative for nausea and vomiting.  Musculoskeletal:       Negative for muscle weakness  Neurological:       Negative lightheadedness    PHYSICAL EXAM: Blood pressure 125/82, pulse (!) 59, temperature (!) 97.4 F (36.3 C), temperature source Oral, height 5\' 9"  (1.753 m), weight 233 lb (105.7 kg), SpO2 95 %. Body mass index is 34.41 kg/m. Physical Exam  Constitutional: She is oriented to person, place, and time. She appears well-developed and well-nourished.  Cardiovascular: Normal rate and regular rhythm.  Pulmonary/Chest: Effort normal.  Musculoskeletal: Normal range of motion.  Neurological: She is oriented to person, place, and time.  Skin: Skin is warm and dry.  Psychiatric: She has a normal mood and affect.    Vitals reviewed.   RECENT LABS AND TESTS: BMET    Component Value Date/Time   NA 143 07/15/2016 0802   K 4.7 07/15/2016 0802   CL 106 07/15/2016 0802   CO2 24 07/15/2016 0802   GLUCOSE 90 07/15/2016 0802   BUN 15 07/15/2016 0802   CREATININE 1.00 07/15/2016 0802   CALCIUM 9.3 07/15/2016 0802   GFRNONAA 64 07/15/2016 0802   GFRAA 74 07/15/2016 0802   Lab Results  Component Value Date   HGBA1C 5.6 07/15/2016   HGBA1C 5.7 (H) 02/13/2016   Lab Results  Component Value Date   INSULIN 15.6 07/15/2016   INSULIN 20.1 02/13/2016   CBC    Component Value Date/Time   WBC 7.0 02/13/2016 0947   RBC 5.06 02/13/2016 0947   HGB 15.2 02/13/2016 0947   HCT 44.7 02/13/2016 0947   MCV 88 02/13/2016 0947   MCH 30.0 02/13/2016 0947   MCHC 34.0 02/13/2016 0947   RDW 13.7 02/13/2016 0947   LYMPHSABS 2.1 02/13/2016 0947   EOSABS 0.1 02/13/2016 0947   BASOSABS 0.0 02/13/2016 0947   Iron/TIBC/Ferritin/ %Sat No results found for: IRON, TIBC, FERRITIN, IRONPCTSAT Lipid Panel     Component Value Date/Time   CHOL 135 07/15/2016 0802   TRIG 109 07/15/2016 0802   HDL 38 (L) 07/15/2016 0802   LDLCALC 75 07/15/2016 0802   Hepatic Function Panel     Component Value Date/Time   PROT 6.7 07/15/2016 0802   ALBUMIN 4.2 07/15/2016 0802   AST 17 07/15/2016 0802   ALT 15 07/15/2016 0802   ALKPHOS 77 07/15/2016 0802   BILITOT 0.3 07/15/2016 0802      Component Value Date/Time   TSH 0.808 07/15/2016 0802   TSH 7.140 (H) 02/13/2016 0947    ASSESSMENT AND PLAN: Abnormal EKG - Plan: EKG 12-Lead  Vitamin D deficiency - Plan: Vitamin D, Ergocalciferol, (DRISDOL) 50000 units CAPS capsule  At risk for heart disease  Class 1 obesity with serious comorbidity and body mass index (BMI) of 34.0 to 34.9 in adult, unspecified obesity type - Plan: phentermine 15 MG capsule  PLAN:  Vitamin D Deficiency Jennifer Riley was informed that low vitamin D levels contributes to fatigue and are associated  with obesity, breast, and colon cancer. She agrees to continue to take prescription Vit D @50 ,000 IU every week and will follow up for routine testing of vitamin D, at least 2-3 times per year. She was informed of the risk of over-replacement of vitamin D and agrees to not increase her dose unless she discusses this with [place] first.  Abnormal EKG Repeat EKG was within normal limits. No further testing is needed. She agrees to follow up with our clinic in 4 weeks.   Cardiovascular risk counseling Jennifer Riley was given extended (15  minutes) coronary artery disease prevention counseling today. She is 57 y.o. female and has risk factors for heart disease including obesity. We discussed intensive lifestyle modifications today with an emphasis on specific weight loss instructions and strategies. Pt was also informed of the importance of increasing exercise and decreasing saturated fats to help prevent heart disease.  Obesity Jennifer Riley is currently in the action stage of change. As such, her goal is to continue with weight loss efforts She has agreed to follow the Category 2 plan Jennifer Riley has been instructed to work up to a goal of 150 minutes of combined cardio and strengthening exercise per week for weight loss and overall health benefits. We discussed the following Behavioral Modification Strategies today: increasing lean protein intake  We discussed various medication options to help Jennifer Riley with her weight loss efforts and we both agreed to start phentermine 15 mg qam #30 with no refills (no print). We will repeat EKG.  Jennifer Riley has agreed to follow up with our clinic in 4 weeks and with our registered dietician in 2 weeks. She was informed of the importance of frequent follow up visits to maximize her success with intensive lifestyle modifications for her multiple health conditions.   OBESITY BEHAVIORAL INTERVENTION VISIT  Today's visit was # 16 out of 22.  Starting weight: 223 lbs Starting  date: 02/13/16 Today's weight : 233 lbs Today's date: 03/26/2017 Total lbs lost to date: 10 (Patients must lose 7 lbs in the first 6 months to continue with counseling)   ASK: We discussed the diagnosis of obesity with [name] today and Jennifer Riley agreed to give [place] permission to discuss obesity behavioral modification therapy today.  ASSESS: Jennifer Riley has the diagnosis of obesity and her BMI today is 34.39 Jennifer Riley is in the action stage of change   ADVISE: Jennifer Riley was educated on the multiple health risks of obesity as well as the benefit of weight loss to improve her health. She was advised of the need for long term treatment and the importance of lifestyle modifications.  AGREE: Multiple dietary modification options and treatment options were discussed and  Jennifer Riley agreed to the above obesity treatment plan.  I, [place] Crane, am acting as transcriptionist for Quillian Quince, MD  I have reviewed the above documentation for accuracy and completeness, and I agree with the above. -Quillian Quince, MD

## 2017-03-31 MED FILL — SYNTHROID 150 MCG TABLET: 150 | 30 days supply | Qty: 30 | Fill #1

## 2017-04-09 ENCOUNTER — Ambulatory Visit (INDEPENDENT_AMBULATORY_CARE_PROVIDER_SITE_OTHER): Payer: 59 | Admitting: Dietician

## 2017-04-09 VITALS — Ht 69.0 in | Wt 227.0 lb

## 2017-04-09 DIAGNOSIS — R7303 Prediabetes: Secondary | ICD-10-CM

## 2017-04-09 DIAGNOSIS — Z9189 Other specified personal risk factors, not elsewhere classified: Secondary | ICD-10-CM | POA: Diagnosis not present

## 2017-04-09 DIAGNOSIS — E669 Obesity, unspecified: Secondary | ICD-10-CM | POA: Diagnosis not present

## 2017-04-09 DIAGNOSIS — Z6833 Body mass index (BMI) 33.0-33.9, adult: Secondary | ICD-10-CM

## 2017-04-10 MED FILL — TRINTELLIX 20 MG TABLET: 20 | 30 days supply | Qty: 30 | Fill #0

## 2017-04-10 NOTE — Progress Notes (Signed)
  Office: [phone]  /  Fax: [phone]     Jennifer Riley has a diagnosis of prediabetes based on her elevated HgA1c and was informed this puts her at greater risk of developing diabetes.  Jennifer Riley is here today for intensive diabetic risk nutrition counseling which includes her obesity treatment plan.   Her weight today is 227 lbs, a 6 lb weight loss since her last visit. She is following a journaling meal plan of 1200-1400 calories and 75+ grams of protein per day. She is journaling approximately 75% of the time. Per review of her food journal she is getting the majority of her protein from protein supplements with increased simple carbohydrates. Patient was educated about food nutrients ie protein, fats, simple and complex carbohydrates and lean protein from food sources and how these affect insulin response. Focus on portion control,  avoiding simple carbohydrates, increasing lean protein from food sources to increase satiety, ongoing wt loss efforts and glucose management. She reports meeting her calorie and protein goals daily. She is taking phentermine 15mg .   Jennifer Riley is on the following meal plan: 1200-1400 calories and 75+ grams protein journaling her food intake.  Her meal plan was individualized for maximum benefit.  Also discussed at length the following behavioral modifications to help maximize  Success: increasing lean protein intake, decreasing simple carbohydrates, increasing vegetables,  Decreasing liquid calories, meal planning and cooking strategies, planning for success, keeping a strict food journal.    Jennifer Riley has been instructed to work up to a goal of 150 minutes of combined cardio and strengthening exercise per week for weight loss and overall health benefits. Written information was provided and the following handouts were given:  Insulin resistance/prediabetes/diabestes.   Office: [phone]  /  Fax: [phone]  OBESITY BEHAVIORAL INTERVENTION  VISIT  Today's visit was # 17 out of 22.  Starting weight: 223 Starting date: 02/13/16 Today's weight : Weight: 227 lb (103 kg)  Today's date:04/09/17 Total lbs lost to date: 0 (Patients must lose 7 lbs in the first 6 months to continue with counseling)   ASK: We discussed the diagnosis of obesity with Janeann ForehandKatherine Szczygiel today and Jennifer Riley agreed to give us permission to discuss obesity behavioral modification therapy today.  ASSESS: Jennifer Riley has the diagnosis of obesity and her BMI today is 33.51 Jennifer Riley is in the action stage of change   ADVISE: Jennifer Riley was educated on the multiple health risks of obesity as well as the benefit of weight loss to improve her health. She was advised of the need for long term treatment and the importance of lifestyle modifications.  AGREE: Multiple dietary modification options and treatment options were discussed and  Jennifer Riley agreed to keep a food journal with 1200-1400 calories and 75+ g  protein  We discussed the following Behavioral Modification Stratagies today: increasing lean protein intake, decreasing simple carbohydrates , increasing vegetables, work on meal planning and easy cooking plans and decrease liquid calories

## 2017-04-23 ENCOUNTER — Ambulatory Visit (INDEPENDENT_AMBULATORY_CARE_PROVIDER_SITE_OTHER): Payer: 59 | Admitting: Family Medicine

## 2017-04-23 VITALS — BP 120/78 | HR 62 | Temp 97.7°F | Ht 69.0 in | Wt 224.0 lb

## 2017-04-23 DIAGNOSIS — Z6833 Body mass index (BMI) 33.0-33.9, adult: Secondary | ICD-10-CM | POA: Diagnosis not present

## 2017-04-23 DIAGNOSIS — Z9189 Other specified personal risk factors, not elsewhere classified: Secondary | ICD-10-CM

## 2017-04-23 DIAGNOSIS — E669 Obesity, unspecified: Secondary | ICD-10-CM | POA: Diagnosis not present

## 2017-04-23 DIAGNOSIS — E559 Vitamin D deficiency, unspecified: Secondary | ICD-10-CM | POA: Diagnosis not present

## 2017-04-23 MED ORDER — VITAMIN D (ERGOCALCIFEROL) 1.25 MG (50000 UNIT) PO CAPS: 50000.0000 [IU] | ORAL_CAPSULE | ORAL | 0 refills | Status: DC

## 2017-04-23 MED FILL — VIT D2 1.25 MG (50,000 UNIT: 1.25 MG | 28 days supply | Qty: 4 | Fill #0

## 2017-04-24 MED ORDER — PHENTERMINE HCL 37.5 MG PO CAPS: 37.5000 mg | ORAL_CAPSULE | ORAL | 0 refills | Status: DC

## 2017-04-24 MED FILL — PHENTERMINE 37.5 MG TABLET: 37.5 | 20 days supply | Qty: 20 | Fill #0

## 2017-04-24 NOTE — Progress Notes (Signed)
Office: [phone]  /  Fax: [phone]   HPI:   Chief Complaint: OBESITY Jennifer LeatherwoodKatherine is here to discuss her progress with her obesity treatment plan. She is on the Category 2 plan and is following her eating plan approximately 80 % of the time. She states she is walking 5 minutes 7 times per week. Jennifer LeatherwoodKatherine continues to do well with weight loss and her blood pressure is stable. She has no insomnia and is actually sleeping better. She notes hunger is starting to increase at times and wonders if her dose can be increased.Marland Kitchen. Her weight is 224 lb (101.6 kg) today and has had a weight loss of 3 pounds over a period of 4 weeks since her last visit. She has gained 1 lb since starting treatment with us.  Vitamin D deficiency Jennifer LeatherwoodKatherine has a diagnosis of vitamin D deficiency. She is stable on vit D and is not yet at goal. Fatigue is improved and she denies nausea, vomiting or muscle weakness.   Ref. Range 07/15/2016 08:02  Vitamin D, 25-Hydroxy Latest Ref Range: 30.0 - 100.0 ng/mL 44.1   At risk for cardiovascular disease Jennifer LeatherwoodKatherine is at a higher than average risk for cardiovascular disease due to obesity. She currently denies any chest pain.  ALLERGIES: Allergies  Allergen Reactions  . Wellbutrin [Bupropion]     delusion    MEDICATIONS: Current Outpatient Medications on File Prior to Visit  Medication Sig Dispense Refill  . ALPRAZolam (XANAX) 0.25 MG tablet Take 0.25 mg by mouth.    . Insulin Pen Needle 31G X 8 MM MISC Use daily as directed 100 each 0  . levothyroxine (SYNTHROID, LEVOTHROID) 137 MCG tablet Take 137 mcg by mouth daily before breakfast.    . tretinoin (RETIN-A) 0.025 % cream Apply topically at bedtime.    . vortioxetine HBr (TRINTELLIX) 20 MG TABS Take 20 mg by mouth every morning.     No current facility-administered medications on file prior to visit.     PAST MEDICAL HISTORY: Past Medical History:  Diagnosis Date  . Anxiety   . Constipation   . Depression   .  Gallbladder problem   . Hypothyroidism   . Insomnia   . Joint pain   . Obesity     PAST SURGICAL HISTORY: Past Surgical History:  Procedure Laterality Date  . GALLBLADDER SURGERY     06/2001  . TUBAL LIGATION     03/19/1990    SOCIAL HISTORY: Social History   Tobacco Use  . Smoking status: Former Smoker    Last attempt to quit: 02/12/1993    Years since quitting: 24.2  . Smokeless tobacco: Never Used  Substance Use Topics  . Alcohol use: Not on file  . Drug use: Not on file    FAMILY HISTORY: Family History  Problem Relation Age of Onset  . Alcohol abuse Mother   . Cancer Father   . Alcohol abuse Father   . Kidney disease Father     ROS: Review of Systems  Constitutional: Positive for weight loss. Negative for malaise/fatigue.  Cardiovascular: Negative for chest pain.  Gastrointestinal: Negative for nausea and vomiting.  Musculoskeletal:       Negative for muscle weakness    PHYSICAL EXAM: Blood pressure 120/78, pulse 62, temperature 97.7 F (36.5 C), temperature source Oral, height 5\' 9"  (1.753 m), weight 224 lb (101.6 kg), SpO2 97 %. Body mass index is 33.08 kg/m. Physical Exam  Constitutional: She is oriented to person, place, and time. She appears  well-developed and well-nourished.  Cardiovascular: Normal rate.  Pulmonary/Chest: Effort normal.  Musculoskeletal: Normal range of motion.  Neurological: She is oriented to person, place, and time.  Skin: Skin is warm and dry.  Psychiatric: She has a normal mood and affect. Her behavior is normal.  Vitals reviewed.   RECENT LABS AND TESTS: BMET    Component Value Date/Time   NA 143 07/15/2016 0802   K 4.7 07/15/2016 0802   CL 106 07/15/2016 0802   CO2 24 07/15/2016 0802   GLUCOSE 90 07/15/2016 0802   BUN 15 07/15/2016 0802   CREATININE 1.00 07/15/2016 0802   CALCIUM 9.3 07/15/2016 0802   GFRNONAA 64 07/15/2016 0802   GFRAA 74 07/15/2016 0802   Lab Results  Component Value Date   HGBA1C 5.6  07/15/2016   HGBA1C 5.7 (H) 02/13/2016   Lab Results  Component Value Date   INSULIN 15.6 07/15/2016   INSULIN 20.1 02/13/2016   CBC    Component Value Date/Time   WBC 7.0 02/13/2016 0947   RBC 5.06 02/13/2016 0947   HGB 15.2 02/13/2016 0947   HCT 44.7 02/13/2016 0947   MCV 88 02/13/2016 0947   MCH 30.0 02/13/2016 0947   MCHC 34.0 02/13/2016 0947   RDW 13.7 02/13/2016 0947   LYMPHSABS 2.1 02/13/2016 0947   EOSABS 0.1 02/13/2016 0947   BASOSABS 0.0 02/13/2016 0947   Iron/TIBC/Ferritin/ %Sat No results found for: IRON, TIBC, FERRITIN, IRONPCTSAT Lipid Panel     Component Value Date/Time   CHOL 135 07/15/2016 0802   TRIG 109 07/15/2016 0802   HDL 38 (L) 07/15/2016 0802   LDLCALC 75 07/15/2016 0802   Hepatic Function Panel     Component Value Date/Time   PROT 6.7 07/15/2016 0802   ALBUMIN 4.2 07/15/2016 0802   AST 17 07/15/2016 0802   ALT 15 07/15/2016 0802   ALKPHOS 77 07/15/2016 0802   BILITOT 0.3 07/15/2016 0802      Component Value Date/Time   TSH 0.808 07/15/2016 0802   TSH 7.140 (H) 02/13/2016 0947    ASSESSMENT AND PLAN: Vitamin D deficiency - Plan: Vitamin D, Ergocalciferol, (DRISDOL) 50000 units CAPS capsule  At risk for heart disease  Class 1 obesity with serious comorbidity and body mass index (BMI) of 33.0 to 33.9 in adult, unspecified obesity type - Plan: phentermine 37.5 MG capsule  PLAN:  Vitamin D Deficiency Jennifer Riley was informed that low vitamin D levels contributes to fatigue and are associated with obesity, breast, and colon cancer. She agrees to continue to take prescription Vit D @50 ,000 IU every week #4 with no refills. We will recheck labs in 1 month and she will follow up for routine testing of vitamin D, at least 2-3 times per year. She was informed of the risk of over-replacement of vitamin D and agrees to not increase her dose unless she discusses this with [place] first. Jennifer Riley agrees to follow up with our clinic in 3 to 4  weeks.  Cardiovascular risk counseling Jennifer Riley was given extended (15 minutes) coronary artery disease prevention counseling today. She is 57 y.o. female and has risk factors for heart disease including obesity. We discussed intensive lifestyle modifications today with an emphasis on specific weight loss instructions and strategies. Pt was also informed of the importance of increasing exercise and decreasing saturated fats to help prevent heart disease.  Obesity Jennifer Riley is currently in the action stage of change. As such, her goal is to continue with weight loss efforts She has agreed to  keep a food journal with 1200 to 1400 calories and 75+ grams of protein daily Jennifer Riley has been instructed to work up to a goal of 150 minutes of combined cardio and strengthening exercise per week for weight loss and overall health benefits. We discussed the following Behavioral Modification Strategies today: keeping healthy foods in the home, better snacking choices,  increasing lean protein intake and decreasing simple carbohydrates  We discussed various medication options to help Jennifer Riley with her weight loss efforts and we both agreed to increase Phentermine to 37.5 mg 1/2-1 po qd #20 with no refills (no print).  Jennifer Riley has agreed to follow up with our clinic in 3 to 4 weeks. She was informed of the importance of frequent follow up visits to maximize her success with intensive lifestyle modifications for her multiple health conditions.   OBESITY BEHAVIORAL INTERVENTION VISIT  Today's visit was # 17 out of 22.  Starting weight: 223 lbs Starting date: 02/13/16 Today's weight : 224 lbs Today's date: 04/23/2017 Total lbs lost to date: 0 (Patients must lose 7 lbs in the first 6 months to continue with counseling)   ASK: We discussed the diagnosis of obesity with [name] today and Jennifer Riley agreed to give [place] permission to discuss obesity behavioral modification therapy  today.  ASSESS: Jennifer Riley has the diagnosis of obesity and her BMI today is 33.06 Jennifer Riley is in the action stage of change   ADVISE: Jennifer Riley was educated on the multiple health risks of obesity as well as the benefit of weight loss to improve her health. She was advised of the need for long term treatment and the importance of lifestyle modifications.  AGREE: Multiple dietary modification options and treatment options were discussed and  Jennifer Riley agreed to the above obesity treatment plan.  I, Nevada Crane, am acting as transcriptionist for Quillian Quince, MD  I have reviewed the above documentation for accuracy and completeness, and I agree with the above. -Quillian Quince, MD

## 2017-05-01 MED FILL — SYNTHROID 150 MCG TABLET: 150 | 30 days supply | Qty: 30 | Fill #2

## 2017-05-16 MED FILL — TRINTELLIX 20 MG TABLET: 20 | 30 days supply | Qty: 30 | Fill #1

## 2017-05-19 ENCOUNTER — Ambulatory Visit (INDEPENDENT_AMBULATORY_CARE_PROVIDER_SITE_OTHER): Payer: 59 | Admitting: Family Medicine

## 2017-05-19 VITALS — BP 121/81 | HR 68 | Temp 97.4°F | Ht 69.0 in | Wt 221.0 lb

## 2017-05-19 DIAGNOSIS — E669 Obesity, unspecified: Secondary | ICD-10-CM | POA: Diagnosis not present

## 2017-05-19 DIAGNOSIS — Z9189 Other specified personal risk factors, not elsewhere classified: Secondary | ICD-10-CM

## 2017-05-19 DIAGNOSIS — Z6832 Body mass index (BMI) 32.0-32.9, adult: Secondary | ICD-10-CM

## 2017-05-19 DIAGNOSIS — E559 Vitamin D deficiency, unspecified: Secondary | ICD-10-CM

## 2017-05-20 MED ORDER — VITAMIN D (ERGOCALCIFEROL) 1.25 MG (50000 UNIT) PO CAPS: 50000.0000 [IU] | ORAL_CAPSULE | ORAL | 0 refills | Status: DC

## 2017-05-20 MED ORDER — PHENTERMINE HCL 37.5 MG PO CAPS: 37.5000 mg | ORAL_CAPSULE | ORAL | 0 refills | Status: DC

## 2017-05-20 MED FILL — VIT D2 1.25 MG (50,000 UNIT: 1.25 MG | 28 days supply | Qty: 4 | Fill #0

## 2017-05-20 MED FILL — PHENTERMINE 37.5 MG TABLET: 37.5 | 30 days supply | Qty: 30 | Fill #0

## 2017-05-20 NOTE — Progress Notes (Signed)
Office: [phone]  /  Fax: [phone]   HPI:   Chief Complaint: OBESITY Jennifer LeatherwoodKatherine is here to discuss her progress with her obesity treatment plan. She is on the keep a food journal with 1200-1400 calories and 75+ grams of protein daily and is following her eating plan approximately 90 % of the time. She states she is exercising 0 minutes 0 times per week. Aurielle increased phentermine to 1 pill daily and feels she needs this higher dose to control her excessive hunger. She denies insomnia.  Her weight is 221 lb (100.2 kg) today and has had a weight loss of 3 pounds over a period of 3 to 4 weeks since her last visit. She has lost 2 lbs since starting treatment with us.  Vitamin D Deficiency Jennifer LeatherwoodKatherine has a diagnosis of vitamin D deficiency. She is stable on prescription Vit D, due for labs. She denies nausea, vomiting or muscle weakness.  At risk for osteopenia and osteoporosis Jennifer LeatherwoodKatherine is at higher risk of osteopenia and osteoporosis due to vitamin D deficiency.   ALLERGIES: Allergies  Allergen Reactions  . Wellbutrin [Bupropion]     delusion    MEDICATIONS: Current Outpatient Medications on File Prior to Visit  Medication Sig Dispense Refill  . ALPRAZolam (XANAX) 0.25 MG tablet Take 0.25 mg by mouth.    . Insulin Pen Needle 31G X 8 MM MISC Use daily as directed 100 each 0  . levothyroxine (SYNTHROID, LEVOTHROID) 137 MCG tablet Take 137 mcg by mouth daily before breakfast.    . phentermine 37.5 MG capsule Take 1 capsule (37.5 mg total) by mouth every morning. 20 capsule 0  . tretinoin (RETIN-A) 0.025 % cream Apply topically at bedtime.    . Vitamin D, Ergocalciferol, (DRISDOL) 50000 units CAPS capsule Take 1 capsule (50,000 Units total) by mouth every 7 (seven) days. 4 capsule 0  . vortioxetine HBr (TRINTELLIX) 20 MG TABS Take 20 mg by mouth every morning.     No current facility-administered medications on file prior to visit.     PAST MEDICAL HISTORY: Past Medical  History:  Diagnosis Date  . Anxiety   . Constipation   . Depression   . Gallbladder problem   . Hypothyroidism   . Insomnia   . Joint pain   . Obesity     PAST SURGICAL HISTORY: Past Surgical History:  Procedure Laterality Date  . GALLBLADDER SURGERY     06/2001  . TUBAL LIGATION     03/19/1990    SOCIAL HISTORY: Social History   Tobacco Use  . Smoking status: Former Smoker    Last attempt to quit: 02/12/1993    Years since quitting: 24.2  . Smokeless tobacco: Never Used  Substance Use Topics  . Alcohol use: Not on file  . Drug use: Not on file    FAMILY HISTORY: Family History  Problem Relation Age of Onset  . Alcohol abuse Mother   . Cancer Father   . Alcohol abuse Father   . Kidney disease Father     ROS: Review of Systems  Constitutional: Positive for weight loss.  Gastrointestinal: Negative for nausea and vomiting.  Musculoskeletal:       Negative muscle weakness  Psychiatric/Behavioral: The patient does not have insomnia.     PHYSICAL EXAM: Blood pressure 121/81, pulse 68, temperature (!) 97.4 F (36.3 C), temperature source Oral, height 5\' 9"  (1.753 m), weight 221 lb (100.2 kg), SpO2 95 %. Body mass index is 32.64 kg/m. Physical Exam  RECENT  LABS AND TESTS: BMET    Component Value Date/Time   NA 143 07/15/2016 0802   K 4.7 07/15/2016 0802   CL 106 07/15/2016 0802   CO2 24 07/15/2016 0802   GLUCOSE 90 07/15/2016 0802   BUN 15 07/15/2016 0802   CREATININE 1.00 07/15/2016 0802   CALCIUM 9.3 07/15/2016 0802   GFRNONAA 64 07/15/2016 0802   GFRAA 74 07/15/2016 0802   Lab Results  Component Value Date   HGBA1C 5.6 07/15/2016   HGBA1C 5.7 (H) 02/13/2016   Lab Results  Component Value Date   INSULIN 15.6 07/15/2016   INSULIN 20.1 02/13/2016   CBC    Component Value Date/Time   WBC 7.0 02/13/2016 0947   RBC 5.06 02/13/2016 0947   HGB 15.2 02/13/2016 0947   HCT 44.7 02/13/2016 0947   MCV 88 02/13/2016 0947   MCH 30.0 02/13/2016 0947     MCHC 34.0 02/13/2016 0947   RDW 13.7 02/13/2016 0947   LYMPHSABS 2.1 02/13/2016 0947   EOSABS 0.1 02/13/2016 0947   BASOSABS 0.0 02/13/2016 0947   Iron/TIBC/Ferritin/ %Sat No results found for: IRON, TIBC, FERRITIN, IRONPCTSAT Lipid Panel     Component Value Date/Time   CHOL 135 07/15/2016 0802   TRIG 109 07/15/2016 0802   HDL 38 (L) 07/15/2016 0802   LDLCALC 75 07/15/2016 0802   Hepatic Function Panel     Component Value Date/Time   PROT 6.7 07/15/2016 0802   ALBUMIN 4.2 07/15/2016 0802   AST 17 07/15/2016 0802   ALT 15 07/15/2016 0802   ALKPHOS 77 07/15/2016 0802   BILITOT 0.3 07/15/2016 0802      Component Value Date/Time   TSH 0.808 07/15/2016 0802   TSH 7.140 (H) 02/13/2016 0947  Results for Bevis, Jennifer Riley (MRN [id number]) as of 05/20/2017 08:19  Ref. Range 07/15/2016 08:02  Vitamin D, 25-Hydroxy Latest Ref Range: 30.0 - 100.0 ng/mL 44.1    ASSESSMENT AND PLAN: Vitamin D deficiency - Plan: Vitamin D, Ergocalciferol, (DRISDOL) 50000 units CAPS capsule  At risk for osteoporosis  Class 1 obesity with serious comorbidity and body mass index (BMI) of 32.0 to 32.9 in adult, unspecified obesity type - Plan: phentermine 37.5 MG capsule  PLAN:  Vitamin D Deficiency Jennifer Riley was informed that low vitamin D levels contributes to fatigue and are associated with obesity, breast, and colon cancer. Jennifer Riley agrees to continue taking prescription Vit D @50 ,000 IU every week #4 and we will refill for 1 month. She will follow up for routine testing of vitamin D, at least 2-3 times per year. She was informed of the risk of over-replacement of vitamin D and agrees to not increase her dose unless she discusses this with [place] first. Jennifer Riley agrees to follow up with our clinic in 3 to 4 weeks.  At risk for osteopenia and osteoporosis Jennifer Riley is at higher risk of osteopenia and osteoporosis due to vitamin D deficiency.   Obesity Jennifer Riley is currently in the action stage of  change. As such, her goal is to continue with weight loss efforts She has agreed to keep a food journal with 1200-1400 calories and 75+ grams of protein daily Jennifer Riley has been instructed to work up to a goal of 150 minutes of combined cardio and strengthening exercise per week for weight loss and overall health benefits. We discussed the following Behavioral Modification Strategies today: increasing lean protein intake and decreasing simple carbohydrates  We discussed various medication options to help Jennifer Riley with her weight loss efforts and we  both agreed to refill phentermine 37.5 mg q AM #30 with no refills, no print.  Jennifer Riley has agreed to follow up with our clinic in 3 to 4 weeks. She was informed of the importance of frequent follow up visits to maximize her success with intensive lifestyle modifications for her multiple health conditions.   OBESITY BEHAVIORAL INTERVENTION VISIT  Today's visit was # 18 out of 22.  Starting weight: 223 lbs Starting date: 02/13/16 Today's weight : 221 lbs Today's date: 05/19/2017 Total lbs lost to date: 2 (Patients must lose 7 lbs in the first 6 months to continue with counseling)   ASK: We discussed the diagnosis of obesity with [name] today and Jennifer Riley agreed to give [place] permission to discuss obesity behavioral modification therapy today.  ASSESS: Jennifer Riley has the diagnosis of obesity and her BMI today is 32.62 Jennifer Riley is in the action stage of change   ADVISE: Jennifer Riley was educated on the multiple health risks of obesity as well as the benefit of weight loss to improve her health. She was advised of the need for long term treatment and the importance of lifestyle modifications.  AGREE: Multiple dietary modification options and treatment options were discussed and  Jennifer Riley agreed to the above obesity treatment plan.  I, Burt Knack, am acting as transcriptionist for Quillian Quince, MD  I have reviewed the above  documentation for accuracy and completeness, and I agree with the above. -Quillian Quince, MD

## 2017-06-03 MED FILL — TRETINOIN 0.025% CREAM: 0.025 | 30 days supply | Qty: 20 | Fill #0

## 2017-06-09 MED FILL — SYNTHROID 150 MCG TABLET: 150 | 90 days supply | Qty: 90 | Fill #0

## 2017-06-11 MED FILL — TRINTELLIX 20 MG TABLET: 20 | 30 days supply | Qty: 30 | Fill #2

## 2017-06-16 ENCOUNTER — Encounter (INDEPENDENT_AMBULATORY_CARE_PROVIDER_SITE_OTHER): Payer: Self-pay

## 2017-06-16 ENCOUNTER — Ambulatory Visit (INDEPENDENT_AMBULATORY_CARE_PROVIDER_SITE_OTHER): Payer: 59 | Admitting: Family Medicine

## 2017-07-30 DIAGNOSIS — Z1211 Encounter for screening for malignant neoplasm of colon: Secondary | ICD-10-CM | POA: Diagnosis not present

## 2017-07-30 DIAGNOSIS — F419 Anxiety disorder, unspecified: Secondary | ICD-10-CM | POA: Diagnosis not present

## 2017-07-30 DIAGNOSIS — G479 Sleep disorder, unspecified: Secondary | ICD-10-CM | POA: Diagnosis not present

## 2017-07-30 DIAGNOSIS — Z0000 Encounter for general adult medical examination without abnormal findings: Secondary | ICD-10-CM | POA: Diagnosis not present

## 2017-07-30 DIAGNOSIS — E039 Hypothyroidism, unspecified: Secondary | ICD-10-CM | POA: Diagnosis not present

## 2017-07-30 DIAGNOSIS — E7800 Pure hypercholesterolemia, unspecified: Secondary | ICD-10-CM | POA: Diagnosis not present

## 2017-07-30 DIAGNOSIS — M7062 Trochanteric bursitis, left hip: Secondary | ICD-10-CM | POA: Diagnosis not present

## 2017-07-30 MED FILL — MELOXICAM 15 MG TABLET: 15 | 30 days supply | Qty: 30 | Fill #0

## 2017-08-11 MED FILL — TRINTELLIX 20 MG TABLET: 20 | 90 days supply | Qty: 90 | Fill #0

## 2017-08-15 DIAGNOSIS — Z0000 Encounter for general adult medical examination without abnormal findings: Secondary | ICD-10-CM | POA: Diagnosis not present

## 2017-08-15 DIAGNOSIS — F419 Anxiety disorder, unspecified: Secondary | ICD-10-CM | POA: Diagnosis not present

## 2017-08-15 DIAGNOSIS — E039 Hypothyroidism, unspecified: Secondary | ICD-10-CM | POA: Diagnosis not present

## 2017-08-15 DIAGNOSIS — E782 Mixed hyperlipidemia: Secondary | ICD-10-CM | POA: Diagnosis not present

## 2017-08-15 DIAGNOSIS — G479 Sleep disorder, unspecified: Secondary | ICD-10-CM | POA: Diagnosis not present

## 2017-08-15 DIAGNOSIS — M7062 Trochanteric bursitis, left hip: Secondary | ICD-10-CM | POA: Diagnosis not present

## 2017-08-15 DIAGNOSIS — E7800 Pure hypercholesterolemia, unspecified: Secondary | ICD-10-CM | POA: Diagnosis not present

## 2017-08-20 ENCOUNTER — Encounter (INDEPENDENT_AMBULATORY_CARE_PROVIDER_SITE_OTHER): Payer: Self-pay | Admitting: Family Medicine

## 2017-08-20 DIAGNOSIS — H52223 Regular astigmatism, bilateral: Secondary | ICD-10-CM | POA: Diagnosis not present

## 2017-08-20 DIAGNOSIS — H1045 Other chronic allergic conjunctivitis: Secondary | ICD-10-CM | POA: Diagnosis not present

## 2017-08-20 DIAGNOSIS — H524 Presbyopia: Secondary | ICD-10-CM | POA: Diagnosis not present

## 2017-08-20 DIAGNOSIS — H5213 Myopia, bilateral: Secondary | ICD-10-CM | POA: Diagnosis not present

## 2017-08-20 DIAGNOSIS — H43812 Vitreous degeneration, left eye: Secondary | ICD-10-CM | POA: Diagnosis not present

## 2017-08-21 ENCOUNTER — Encounter: Payer: Self-pay | Admitting: Family Medicine

## 2017-08-21 MED FILL — ALPRAZolam 0.25 MG TABS: 0.25 | 30 days supply | Qty: 30 | Fill #0

## 2017-08-25 ENCOUNTER — Ambulatory Visit (INDEPENDENT_AMBULATORY_CARE_PROVIDER_SITE_OTHER): Payer: 59 | Admitting: Family Medicine

## 2017-08-25 VITALS — BP 119/81 | HR 62 | Temp 98.3°F | Ht 69.0 in | Wt 227.0 lb

## 2017-08-25 DIAGNOSIS — R739 Hyperglycemia, unspecified: Secondary | ICD-10-CM | POA: Diagnosis not present

## 2017-08-25 DIAGNOSIS — E559 Vitamin D deficiency, unspecified: Secondary | ICD-10-CM | POA: Diagnosis not present

## 2017-08-25 DIAGNOSIS — E669 Obesity, unspecified: Secondary | ICD-10-CM | POA: Diagnosis not present

## 2017-08-25 DIAGNOSIS — Z9189 Other specified personal risk factors, not elsewhere classified: Secondary | ICD-10-CM | POA: Diagnosis not present

## 2017-08-25 DIAGNOSIS — Z6833 Body mass index (BMI) 33.0-33.9, adult: Secondary | ICD-10-CM

## 2017-08-25 MED ORDER — INSULIN PEN NEEDLE 32G X 4 MM MISC: 1.0000 | Freq: Two times a day (BID) | 0 refills | Status: AC

## 2017-08-25 MED ORDER — LIRAGLUTIDE -WEIGHT MANAGEMENT 18 MG/3ML ~~LOC~~ SOPN: 3.0000 mg | PEN_INJECTOR | Freq: Every day | SUBCUTANEOUS | 0 refills | Status: DC

## 2017-08-25 MED ORDER — VITAMIN D (ERGOCALCIFEROL) 1.25 MG (50000 UNIT) PO CAPS: 50000.0000 [IU] | ORAL_CAPSULE | ORAL | 0 refills | Status: DC

## 2017-08-25 NOTE — Progress Notes (Unsigned)
Office: [phone]  /  Fax: [phone]  Date: September 02, 2017 Time Seen:*** Duration:*** Provider: Lawerance CruelGaytri Barker, PsyD Type of Session: Intake for Individual Therapy   Informed Consent:The provider's role was explained to [facility]. The provider discussed issues of confidentiality, privacy, and limits therein; anticipated course of treatment; potential risks involved with psychotherapy; the voluntary nature of treatment; and the clinic's cancellation policy. In addition to written consent, verbal informed consent for psychological services was obtained from PaulineKatherine prior to the initial intake interview.   Jennifer Riley was informed that information about mental health appointments will be entered in [facility] via Epic. Moreover, Jennifer Riley agreed information may be shared with other [facility] providers as needed for coordination of care. Written consent was also provided for this provider to coordinate care with other providers at [facility].The provider further explained leaving voicemail messages and sending messages via MyChart can be utilized for non-emergency reasons, and limits of confidentiality related to communication via technology was discussed. Furthermore, Jennifer Riley was informed the clinic is not a 24/7 crisis center and mental health emergency resources were shared. Jennifer Riley was given a handout with emergency resources. Jennifer Riley verbally acknowledged understanding, and agreed to use mental health emergency resources discussed if needed.   Chief Complaint: Jennifer Riley was referred by Dr. Quillian Quincearen Beasley . Per the note for the visit with Dr. Dalbert GarnetBeasley on August 25, 2017, "She is off track and feeling frustrated and guilty. She has been on Phentermine, but it has caused insomnia. She did well on Saxenda in the past, but has increased emotional and stress eating. She now realizes she uses food for [place]  and eats to decrease negative emotions."  Jennifer Riley was asked to complete a questionnaire assessing various behaviors related to emotional eating. Jennifer Riley endorsed the following:  Overeat when you are celebrating Experience food cravings on a regular basis Eat certain foods when you are anxious, stressed, depressed, or your feelings are hurt Use food to help you cope with emotional situations Find food is comforting to you Overeat when you are angry or upset Overeat when you are worried about something Overeat frequently when you are bored or lonely Not worry about what you eat when you are in a good mood Overeat when you are angry at someone just to show them they cannot control you Overeat when you are alone, but eat much less when you are with other people Eat to help you stay awake Eat as a reward   HPI: Per the note for the initial visit with Dr. Dalbert GarnetBeasley on February 13, 2016, "Jennifer Riley states she eats out three times a week. She considers herself a picky eater. Jennifer Riley tends to snack on sweets to include chocolate, cookies, and ice cream. Jennifer Riley states she is trying to become a vegetaarin. Jennifer Riley states she eats until she feels stuffed. She is an Surveyor, quantityemotional eater. Jennifer Riley states she is a binge eater." Loyda's Food and Mood (modified PHQ-9) score for that visit was 21.  Mental Status Examination: Jennifer Riley arrived on time for the appointment. She presented as appropriately dressed and groomed. Jennifer Riley appeared her stated age and demonstrated adequate orientation to time, place, person, and purpose of the appointment. She also demonstrated appropriate eye contact. No psychomotor abnormalities or behavioral peculiarities noted. Her mood was *** with congruent affect. Her thought processes were logical, linear, and goal-directed. No hallucinations, delusions, bizarre thinking or behavior reported or observed. Judgment, insight, and impulse control appeared to  be grossly intact.  There was no evidence of paraphasias (i.e., errors in speech, gross mispronunciations, and word substitutions), repetition deficits, or disturbances in volume or prosody (i.e., rhythm and intonation). There was no evidence of attention or memory impairments. Jennifer Leatherwood denied current suicidal and homicidal ideation, plan, and intent.   The Mini-Mental State Examination, Second Edition (MMSE-2) was administered. The MMSE-2 briefly screens for cognitive dysfunction and overall mental status and assesses different cognitive domains: orientation, registration, attention and calculation, recall, and language and praxis. Kennetta received *** out of 30 points possible on the MMSE-2, which is noted in the *** range. The following points were lost: ***  Family & Psychosocial History: ***  Medical History: ***  Mental Health History: ***  Structured Assessment Results: The Patient Health Questionnaire-9 (PHQ-9) is a self-report measure that assesses symptoms and severity of depression over the course of the last two weeks. Jennifer Leatherwood obtained a score of *** suggesting *** depression. Jennifer Leatherwood finds the endorsed symptoms to be ***.    The Generalized Anxiety Disorder-7 (GAD-7) is a brief self-report measure that assesses symptoms of anxiety over the course of the last two weeks. Jennifer Leatherwood obtained a score of *** suggesting *** anxiety.    Interventions: A chart review was conducted prior to the clinical intake interview. The MMSE-2, PHQ-9, and GAD-7 were administered and a clinical intake interview was completed. In addition, Jennifer Leatherwood was asked to complete a Mood and Food questionnaire to assess various behaviors related to emotional eating.  Throughout session, empathic reflections and validation was provided. Continuing treatment with this provider was discussed and treatment goals were established.  Provisional DSM-5 Diagnosis:***  Plan: Jennifer Leatherwood expressed understanding and agreement with the initial  treatment plan of care. She appears able and willing to participate as evidenced by collaboration on treatment goals, engagement in reciprocal conversation, and asking questions as needed for clarification. The next appointment will be scheduled in *** week(s). The following treatment goals were established: ***.

## 2017-08-26 MED FILL — UNIFINE PENTIPS 32GX5/32": 32G X 4 MM | 50 days supply | Qty: 100 | Fill #0

## 2017-08-26 MED FILL — SAXENDA 18 MG/3 ML PEN: 18 | 30 days supply | Qty: 15 | Fill #0

## 2017-08-26 MED FILL — UNIFINE PENTIPS 32GX5/32: 32G X 4 MM | 50 days supply | Qty: 100 | Fill #0

## 2017-08-26 MED FILL — VIT D2 1.25 MG (50,000 UNIT: 1.25 MG | 28 days supply | Qty: 4 | Fill #0

## 2017-08-26 NOTE — Progress Notes (Signed)
Office: [phone]  /  Fax: [phone]   HPI:   Chief Complaint: OBESITY Jennifer Riley is here to discuss her progress with her obesity treatment plan. She is keeping a food journal with 1200 to 1400 calories and 75g+ protein plan and is following her eating plan approximately 10% of the time. She states she is exercising 0 minutes 0 times per week. Jennifer Riley's last visit was approximately 3 months ago. She is off track and feeling frustrated and guilty. She has been on Phentermine, but it has caused insomnia. She did well on Saxenda in the past, but has increased emotional and stress eating. She now realizes she uses food for Voit and eats to decrease negative emotions.    Her weight is 227 lb (103 kg) today and has not lost weight since her last visit. She has gained 4 lbs since starting treatment with [place].  Vitamin D deficiency Jennifer Riley has a diagnosis of vitamin D deficiency. She is currently taking vit D and denies nausea, vomiting or muscle weakness. There are no recent labs.  Hyperglycemia Jennifer Riley has a history of some elevated blood glucose readings without a diagnosis of diabetes. Pt has had an elevated A1c of 5.7 in the past, but had a fasting glucose of 127 at PCP (value is not in Epic).   At risk for diabetes Jennifer Riley is at higher than average risk for developing diabetes due to her obesity and hyperglycemia.  ALLERGIES: Allergies  Allergen Reactions  . Wellbutrin [Bupropion]     delusion    MEDICATIONS: Current Outpatient Medications on File Prior to Visit  Medication Sig Dispense Refill  . ALPRAZolam (XANAX) 0.25 MG tablet Take 0.25 mg by mouth.    . levothyroxine (SYNTHROID, LEVOTHROID) 137 MCG tablet Take 137 mcg by mouth daily before breakfast.    . meloxicam (MOBIC) 15 MG tablet Take 15 mg by mouth daily.    Marland Kitchen tretinoin (RETIN-A) 0.025 % cream Apply topically at bedtime.    . vortioxetine HBr (TRINTELLIX) 20 MG TABS Take 20 mg by mouth every morning.      No current facility-administered medications on file prior to visit.     PAST MEDICAL HISTORY: Past Medical History:  Diagnosis Date  . Anxiety   . Constipation   . Depression   . Gallbladder problem   . Hypothyroidism   . Insomnia   . Joint pain   . Obesity     PAST SURGICAL HISTORY: Past Surgical History:  Procedure Laterality Date  . GALLBLADDER SURGERY     06/2001  . TUBAL LIGATION     03/19/1990    SOCIAL HISTORY: Social History   Tobacco Use  . Smoking status: Former Smoker    Last attempt to quit: 02/12/1993    Years since quitting: 24.5  . Smokeless tobacco: Never Used  Substance Use Topics  . Alcohol use: Not on file  . Drug use: Not on file    FAMILY HISTORY: Family History  Problem Relation Age of Onset  . Alcohol abuse Mother   . Cancer Father   . Alcohol abuse Father   . Kidney disease Father     ROS: Review of Systems  Gastrointestinal: Negative for nausea and vomiting.  Musculoskeletal:       Negative for muscle weakness  Endo/Heme/Allergies:       Positive for hyperglycemia  Psychiatric/Behavioral: Negative for depression and suicidal ideas.    PHYSICAL EXAM: Blood pressure 119/81, pulse 62, temperature 98.3 F (36.8 C), temperature source Oral, height  5\' 9"  (1.753 m), weight 227 lb (103 kg), SpO2 94 %. Body mass index is 33.52 kg/m. Physical Exam  Constitutional: She is oriented to person, place, and time. She appears well-developed and well-nourished.  Cardiovascular: Normal rate.  Pulmonary/Chest: Effort normal.  Musculoskeletal: Normal range of motion.  Neurological: She is oriented to person, place, and time.  Skin: Skin is warm and dry.    RECENT LABS AND TESTS: BMET    Component Value Date/Time   NA 143 07/15/2016 0802   K 4.7 07/15/2016 0802   CL 106 07/15/2016 0802   CO2 24 07/15/2016 0802   GLUCOSE 90 07/15/2016 0802   BUN 15 07/15/2016 0802   CREATININE 1.00 07/15/2016 0802   CALCIUM 9.3 07/15/2016 0802    GFRNONAA 64 07/15/2016 0802   GFRAA 74 07/15/2016 0802   Lab Results  Component Value Date   HGBA1C 5.6 07/15/2016   HGBA1C 5.7 (H) 02/13/2016   Lab Results  Component Value Date   INSULIN 15.6 07/15/2016   INSULIN 20.1 02/13/2016   CBC    Component Value Date/Time   WBC 7.0 02/13/2016 0947   RBC 5.06 02/13/2016 0947   HGB 15.2 02/13/2016 0947   HCT 44.7 02/13/2016 0947   MCV 88 02/13/2016 0947   MCH 30.0 02/13/2016 0947   MCHC 34.0 02/13/2016 0947   RDW 13.7 02/13/2016 0947   LYMPHSABS 2.1 02/13/2016 0947   EOSABS 0.1 02/13/2016 0947   BASOSABS 0.0 02/13/2016 0947   Iron/TIBC/Ferritin/ %Sat No results found for: IRON, TIBC, FERRITIN, IRONPCTSAT Lipid Panel     Component Value Date/Time   CHOL 135 07/15/2016 0802   TRIG 109 07/15/2016 0802   HDL 38 (L) 07/15/2016 0802   LDLCALC 75 07/15/2016 0802   Hepatic Function Panel     Component Value Date/Time   PROT 6.7 07/15/2016 0802   ALBUMIN 4.2 07/15/2016 0802   AST 17 07/15/2016 0802   ALT 15 07/15/2016 0802   ALKPHOS 77 07/15/2016 0802   BILITOT 0.3 07/15/2016 0802      Component Value Date/Time   TSH 0.808 07/15/2016 0802   TSH 7.140 (H) 02/13/2016 0947   Results for Loud, Jennifer LeatherwoodKATHERINE (MRN [id number]) as of 08/26/2017 08:54  Ref. Range 07/15/2016 08:02  Vitamin D, 25-Hydroxy Latest Ref Range: 30.0 - 100.0 ng/mL 44.1   ASSESSMENT AND PLAN: Vitamin D deficiency - Plan: VITAMIN D 25 Hydroxy (Vit-D Deficiency, Fractures), Vitamin D, Ergocalciferol, (DRISDOL) 50000 units CAPS capsule  Hyperglycemia - Plan: Comprehensive metabolic panel, Hemoglobin A1c  At risk for diabetes mellitus  Class 1 obesity with serious comorbidity and body mass index (BMI) of 33.0 to 33.9 in adult, unspecified obesity type - Plan: Liraglutide -Weight Management (SAXENDA) 18 MG/3ML SOPN, Insulin Pen Needle (BD PEN NEEDLE NANO 2ND GEN) 32G X 4 MM MISC  PLAN:  Vitamin D Deficiency Jennifer LeatherwoodKatherine was informed that low vitamin D levels  contributes to fatigue and are associated with obesity, breast, and colon cancer. She agrees to continue to take prescription Vit D @50 ,000 IU every week and will follow up for routine testing of vitamin D, at least 2-3 times per year. She was informed of the risk of over-replacement of vitamin D and agrees to not increase her dose unless she discusses this with us first. She was given a refill of Vitamin D for 1 month and we will recheck labs.  Hyperglycemia We will check an A1c and CMP.and results with be discussed with Jennifer LeatherwoodKatherine in 3 weeks at her follow up  visit. She will start Saxenda and return to the food journal diet plan.  Diabetes risk counselling Jennifer Riley was given extended (15 minutes) diabetes prevention counseling today. She is 57 y.o. female and has risk factors for diabetes including obesity. We discussed intensive lifestyle modifications today with an emphasis on weight loss as well as increasing exercise and decreasing simple carbohydrates in her diet.   Obesity Jennifer Riley is currently in the action stage of change. As such, her goal is to get back to weightloss efforts  She has agreed to keep a food journal with 1200 to 1400 calories and 75g+ protein daily. Jennifer Riley has been instructed to work up to a goal of 150 minutes of combined cardio and strengthening exercise per week for weight loss and overall health benefits. We discussed the following Behavioral Modification Strategies today: increasing lean protein intake, increasing vegetables, emotional eating strategies, and keep a strict food journal. We discussed various medication options to help Jennifer Riley with her weight loss efforts and we both agreed to restart Saxenda 3.0 #5 pens with no refills and nano needles. We will refer to Dr Dewaine Conger, our Bariatric Psychologist for evaluation and therapy. Jennifer Riley has agreed to follow up with our clinic in 3 weeks. She was informed of the importance of frequent follow up visits to  maximize her success with intensive lifestyle modifications for her multiple health conditions.   OBESITY BEHAVIORAL INTERVENTION VISIT  Today's visit was # 19 out of 22.  Starting weight: 223 lbs  Starting date: 02/13/16 Today's weight : Weight: 227 lb (103 kg)  Today's date: 08/26/2017 Total lbs lost to date: 0    ASK: We discussed the diagnosis of obesity with [name] today and Jennifer Riley agreed to give [place] permission to discuss obesity behavioral modification therapy today.  ASSESS: Jennifer Riley has the diagnosis of obesity and her BMI today is 33.51 Jennifer Riley is in the action stage of change   ADVISE: Jennifer Riley was educated on the multiple health risks of obesity as well as the benefit of weight loss to improve her health. She was advised of the need for long term treatment and the importance of lifestyle modifications.  AGREE: Multiple dietary modification options and treatment options were discussed and  Jennifer Riley agreed to the above obesity treatment plan.  I, Kirke Corin, am acting as Energy manager for Quillian Quince, MD  I have reviewed the above documentation for accuracy and completeness, and I agree with the above. -Quillian Quince, MD

## 2017-09-02 ENCOUNTER — Encounter (INDEPENDENT_AMBULATORY_CARE_PROVIDER_SITE_OTHER): Payer: Self-pay

## 2017-09-02 ENCOUNTER — Ambulatory Visit (INDEPENDENT_AMBULATORY_CARE_PROVIDER_SITE_OTHER): Payer: 59 | Admitting: Psychology
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
## 2017-09-15 ENCOUNTER — Ambulatory Visit (INDEPENDENT_AMBULATORY_CARE_PROVIDER_SITE_OTHER): Payer: 59 | Admitting: Psychology

## 2017-09-15 DIAGNOSIS — F331 Major depressive disorder, recurrent, moderate: Secondary | ICD-10-CM | POA: Diagnosis not present

## 2017-09-16 NOTE — Progress Notes (Addendum)
Office: [phone]  /  Fax: [phone]   Date: September 30, 2017 Time Seen: 4:05pm Duration: 35 minutes Provider: Lawerance Cruel, Psy.D. Type of Session: Individual Therapy   HPI: [name] was referred by Dr. Quillian Quince. Per the note for the office visit with Dr. Dalbert Garnet on August 25, 2017, "She is off track and feeling frustrated and guilty. She has been on Phentermine, but it has caused insomnia. She did well on Saxenda in the past, but has increased emotional and stress eating. She now realizes she uses food for Cala and eats to decrease negative emotions. Herweight is 227 lb (103 kg)today and has not lost weightsince herlast visit. Shehas gained 4 lbs since starting treatment with [place]." Per the note for the initial visit with this provider on September 15, 2017, [name] shared, "I eat when I am not hungry and it is mainly at home in the evening." She reported she was prescribed Saxenda, which she reports works. [name] reported, "Occasionally when I overeat, I will purge a little, but that is rare." The last time she purged was a couple weeks ago. [name] was asked to complete a questionnaire assessing various behaviors related to emotional eating. [name] endorsed the following: experience food cravings on a regular basis; eat certain foods when you are anxious, stressed, depressed, or your feelings are hurt; use food to help you cope with emotional situations; find food is comforting to you; overeat when you are worried about something; overeat frequently when you are bored or lonely; and overeat when you are alone, but eat much less when you are with other people. Moreover, per the note for the initial visit with Dr. Dalbert Garnet on February 13, 2016, "states she eats out three times a week. She considers herself a picky eater. [name] tends to snack on sweets to include chocolate, cookies, and ice cream. [name] states she is trying to become a vegetaarin. [name] states she eats  until she feels stuffed. She is an Surveyor, quantity. [name] states she is a binge eater." Ruchama's Food and Mood (modified PHQ-9) score during the initial visit was 21. During the initial visit with this provider, [name] reported emotional eating started when she quit drinking alcohol almost 20 years ago. She denied ever being diagnosed with an eating disorder.  Session Content: Session focused on the following treatment goal: decrease emotional eating. The session was initiated with the administration of the PHQ-9 and GAD-7, as well as a brief check-in. [name] shared her son moved back in with her after a breakup and she noted, "it will help me financially."  However, [name] indicated that her son moving back in with her has resulted in turmoil with her mother. Thus, she shared she is "apprehensive" about seeing her mother in person this coming weekend. [name] shared the aforementioned was a trigger for emotional eating initially. Session focused on processing thoughts and feelings related to her son moving back in and consequent turmoil with her mother. [name] noted being more at peace with the circumstances. Remainder of the session focused on discussing the connection between thoughts, feelings, and behaviors. This provider also discussed pleasurable activities specifically as they impact her thoughts and feelings. In addition this provider also discussed how engagement in pleasurable activities can reduce overall stress levels and subsequent emotional eating. [name] was provided handouts for the aforementioned, and encouraged to engage in pleasurable activities not just to cope with stressors in the moment, but engage in one activity a day to reduce overall stress. She agreed.  Natalia LeatherwoodKatherine was receptive to today's session as evidenced by her sharing about recent events, her willingness to process recent events and her willingness to engage in pleasurable activities between now and the  next appointment.  Mental Status Examination: Natalia LeatherwoodKatherine arrived early for the appointment; therefore, the appointment was initiated early. She presented as appropriately dressed and groomed. Natalia LeatherwoodKatherine appeared her stated age and demonstrated adequate orientation to time, place, person, and purpose of the appointment. She also demonstrated appropriate eye contact. No psychomotor abnormalities or behavioral peculiarities noted. Her mood was euthymic with congruent affect. Her thought processes were logical, linear, and goal-directed. No hallucinations, delusions, bizarre thinking or behavior reported or observed. Judgment, insight, and impulse control appeared to be grossly intact. There was no evidence of paraphasias (i.e., errors in speech, gross mispronunciations, and word substitutions), repetition deficits, or disturbances in volume or prosody (i.e., rhythm and intonation). There was no evidence of attention or memory impairments. Natalia LeatherwoodKatherine denied current suicidal and homicidal ideation, intent or plan.  Structured Assessment Results: The Patient Health Questionnaire-9 (PHQ-9) is a self-report measure that assesses symptoms and severity of depression over the course of the last two weeks. Natalia LeatherwoodKatherine obtained a score of 11 suggesting moderate depression. Natalia LeatherwoodKatherine finds the endorsed symptoms to be somewhat difficult. Depression screen PHQ 2/9 09/30/2017  Decreased Interest 1  Down, Depressed, Hopeless 1  PHQ - 2 Score 2  Altered sleeping 2  Tired, decreased energy 2  Change in appetite 2  Feeling bad or failure about yourself  1  Trouble concentrating 2  Moving slowly or fidgety/restless 0  Suicidal thoughts 0  PHQ-9 Score 11   The Generalized Anxiety Disorder-7 (GAD-7) is a brief self-report measure that assesses symptoms of anxiety over the course of the last two weeks. Natalia LeatherwoodKatherine obtained a score of 10 suggesting moderate anxiety. GAD 7 : Generalized Anxiety Score 09/30/2017  Nervous, Anxious,  on Edge 2  Control/stop worrying 2  Worry too much - different things 1  Trouble relaxing 1  Restless 1  Easily annoyed or irritable 2  Afraid - awful might happen 1  Total GAD 7 Score 10  Anxiety Difficulty Somewhat difficult   Interventions: Natalia LeatherwoodKatherine was administered the PHQ-9 and GAD-7 for symptom monitoring. Content from the last session was reviewed. Throughout today's session, empathic reflections and validation were provided. This provider assisted Natalia LeatherwoodKatherine in processing her thoughts and feelings related to her son moving back in and the subsequent turmoil with her mother. Additionally, the provider normalized as appropriate. Psychoeducation regarding the connection between thoughts, feelings, and behaviors as well as pleasurable activities as they relate to coping with stressors and subsequent emotional eating was provided. Handouts [facility] given to [facility].   DSM-5 Diagnosis: 296.32 (F33.1) Major Depressive Disorder, Recurrent Episode, Moderate, With Anxious Distress   Plan: Natalia LeatherwoodKatherine continues to appear able and willing to participate as evidenced by engagement in reciprocal conversation, and asking questions for clarification as appropriate. The next appointment will be scheduled in two weeks. The next session will focus on reviewing learned skills, and working towards the established treatment goal.   Addendum: Natalia LeatherwoodKatherine was not checked out from her appointment with this provider by front desk staff prior to being seen for her appointment with Dr. Dalbert GarnetBeasley. Thus, the recorded vitals and certain flowsheets for the visit with Dr. Dalbert GarnetBeasley appeared for this encounter erroneously.

## 2017-09-17 ENCOUNTER — Ambulatory Visit (INDEPENDENT_AMBULATORY_CARE_PROVIDER_SITE_OTHER): Payer: 59 | Admitting: Family Medicine

## 2017-09-17 ENCOUNTER — Encounter (INDEPENDENT_AMBULATORY_CARE_PROVIDER_SITE_OTHER): Payer: Self-pay

## 2017-09-30 ENCOUNTER — Ambulatory Visit (INDEPENDENT_AMBULATORY_CARE_PROVIDER_SITE_OTHER): Payer: 59 | Admitting: Psychology

## 2017-09-30 ENCOUNTER — Ambulatory Visit (INDEPENDENT_AMBULATORY_CARE_PROVIDER_SITE_OTHER): Payer: 59 | Admitting: Family Medicine

## 2017-09-30 VITALS — BP 115/83 | HR 84 | Temp 97.9°F | Ht 69.0 in | Wt 227.0 lb

## 2017-09-30 DIAGNOSIS — F331 Major depressive disorder, recurrent, moderate: Secondary | ICD-10-CM

## 2017-09-30 DIAGNOSIS — E559 Vitamin D deficiency, unspecified: Secondary | ICD-10-CM | POA: Diagnosis not present

## 2017-09-30 DIAGNOSIS — Z6833 Body mass index (BMI) 33.0-33.9, adult: Secondary | ICD-10-CM | POA: Diagnosis not present

## 2017-09-30 DIAGNOSIS — E669 Obesity, unspecified: Secondary | ICD-10-CM | POA: Diagnosis not present

## 2017-09-30 DIAGNOSIS — F3289 Other specified depressive episodes: Secondary | ICD-10-CM

## 2017-09-30 DIAGNOSIS — Z9189 Other specified personal risk factors, not elsewhere classified: Secondary | ICD-10-CM | POA: Diagnosis not present

## 2017-09-30 MED ORDER — TOPIRAMATE 50 MG PO TABS: 50.0000 mg | ORAL_TABLET | Freq: Two times a day (BID) | ORAL | 0 refills | Status: DC

## 2017-09-30 MED ORDER — VITAMIN D (ERGOCALCIFEROL) 1.25 MG (50000 UNIT) PO CAPS: 50000.0000 [IU] | ORAL_CAPSULE | ORAL | 0 refills | Status: DC

## 2017-10-01 MED FILL — TOPIRAMATE 50 MG TABLET: 50 | 15 days supply | Qty: 30 | Fill #0

## 2017-10-01 MED FILL — VIT D2 1.25 MG (50,000 UNIT: 1.25 MG | 28 days supply | Qty: 4 | Fill #0

## 2017-10-01 NOTE — Progress Notes (Signed)
Office: [phone]  /  Fax: [phone]   HPI:   Chief Complaint: OBESITY Jennifer Riley is here to discuss her progress with her obesity treatment plan. She is on the keep a food journal with 1200 to 1400 calories and 75+ grams of protein daily and is following her eating plan approximately 60 % of the time. She states she is more active with her grand kids. Jennifer Riley has maintained weight well, but she is still struggling with emotional eating. She is following up with Dr. Dewaine Conger our bariatric psychologist to help deal with this. Her weight is   today and has not lost weight since her last visit. She has lost 4 lbs since starting treatment with [place].  Vitamin D deficiency Jennifer Riley has a diagnosis of vitamin D deficiency. Jennifer Riley is stable on vit D, but she is not yet at goal. Jennifer Riley denies nausea, vomiting or muscle weakness.  At risk for cardiovascular disease Jennifer Riley is at a higher than average risk for cardiovascular disease due to obesity. She currently denies any chest pain.  Depression with emotional eating behaviors Jennifer Riley is on Trintellix and is following up with Dr. Dewaine Conger for therapy. She is still struggling with emotional eating and using food for [place] to the extent that it is negatively impacting her health. She often snacks when she is not hungry. Jennifer Riley sometimes feels she is out of control and then feels guilty that she made poor food choices. She has been working on behavior modification techniques to help reduce her emotional eating and has been somewhat successful. She shows no sign of suicidal or homicidal ideations.  Depression screen [facility] 2/9 09/30/2017 09/15/2017 02/13/2016  Decreased Interest 1 2 3   Down, Depressed, Hopeless 1 2 3   PHQ - 2 Score 2 4 6   Altered sleeping 2 3 3   Tired, decreased energy 2 3 3   Change in appetite 2 3 3   Feeling bad or failure about yourself  1 1 2   Trouble concentrating 2 2 2   Moving slowly or fidgety/restless 0 0 2    Suicidal thoughts 0 1 1  PHQ-9 Score 11 17 22      ALLERGIES: Allergies  Allergen Reactions  . Wellbutrin [Bupropion]     delusion    MEDICATIONS: Current Outpatient Medications on File Prior to Visit  Medication Sig Dispense Refill  . ALPRAZolam (XANAX) 0.25 MG tablet Take 0.25 mg by mouth.    . Insulin Pen Needle (BD PEN NEEDLE NANO 2ND GEN) 32G X 4 MM MISC 1 Package by Does not apply route 2 (two) times daily. 100 each 0  . levothyroxine (SYNTHROID, LEVOTHROID) 137 MCG tablet Take 137 mcg by mouth daily before breakfast.    . Liraglutide -Weight Management (SAXENDA) 18 MG/3ML SOPN Inject 3 mg into the skin daily. 5 pen 0  . meloxicam (MOBIC) 15 MG tablet Take 15 mg by mouth daily.    Marland Kitchen tretinoin (RETIN-A) 0.025 % cream Apply topically at bedtime.    . vortioxetine HBr (TRINTELLIX) 20 MG TABS Take 20 mg by mouth every morning.     No current facility-administered medications on file prior to visit.     PAST MEDICAL HISTORY: Past Medical History:  Diagnosis Date  . Anxiety   . Constipation   . Depression   . Gallbladder problem   . Hypothyroidism   . Insomnia   . Joint pain   . Obesity     PAST SURGICAL HISTORY: Past Surgical History:  Procedure Laterality Date  . GALLBLADDER SURGERY  06/2001  . TUBAL LIGATION     03/19/1990    SOCIAL HISTORY: Social History   Tobacco Use  . Smoking status: Former Smoker    Last attempt to quit: 02/12/1993    Years since quitting: 24.6  . Smokeless tobacco: Never Used  Substance Use Topics  . Alcohol use: Not on file  . Drug use: Not on file    FAMILY HISTORY: Family History  Problem Relation Age of Onset  . Alcohol abuse Mother   . Cancer Father   . Alcohol abuse Father   . Kidney disease Father     ROS: Review of Systems  Constitutional: Negative for weight loss.  Cardiovascular: Negative for chest pain.  Gastrointestinal: Negative for nausea and vomiting.  Musculoskeletal:       Negative for muscle  weakness  Psychiatric/Behavioral: Positive for depression. Negative for suicidal ideas.    PHYSICAL EXAM: There were no vitals taken for this visit. There is no height or weight on file to calculate BMI. Physical Exam  Constitutional: She is oriented to person, place, and time. She appears well-developed and well-nourished.  Cardiovascular: Normal rate.  Pulmonary/Chest: Effort normal.  Musculoskeletal: Normal range of motion.  Neurological: She is oriented to person, place, and time.  Skin: Skin is warm and dry.  Psychiatric: She has a normal mood and affect. Her behavior is normal.  Vitals reviewed.   RECENT LABS AND TESTS: BMET    Component Value Date/Time   NA 143 07/15/2016 0802   K 4.7 07/15/2016 0802   CL 106 07/15/2016 0802   CO2 24 07/15/2016 0802   GLUCOSE 90 07/15/2016 0802   BUN 15 07/15/2016 0802   CREATININE 1.00 07/15/2016 0802   CALCIUM 9.3 07/15/2016 0802   GFRNONAA 64 07/15/2016 0802   GFRAA 74 07/15/2016 0802   Lab Results  Component Value Date   HGBA1C 5.6 07/15/2016   HGBA1C 5.7 (H) 02/13/2016   Lab Results  Component Value Date   INSULIN 15.6 07/15/2016   INSULIN 20.1 02/13/2016   CBC    Component Value Date/Time   WBC 7.0 02/13/2016 0947   RBC 5.06 02/13/2016 0947   HGB 15.2 02/13/2016 0947   HCT 44.7 02/13/2016 0947   MCV 88 02/13/2016 0947   MCH 30.0 02/13/2016 0947   MCHC 34.0 02/13/2016 0947   RDW 13.7 02/13/2016 0947   LYMPHSABS 2.1 02/13/2016 0947   EOSABS 0.1 02/13/2016 0947   BASOSABS 0.0 02/13/2016 0947   Iron/TIBC/Ferritin/ %Sat No results found for: IRON, TIBC, FERRITIN, IRONPCTSAT Lipid Panel     Component Value Date/Time   CHOL 135 07/15/2016 0802   TRIG 109 07/15/2016 0802   HDL 38 (L) 07/15/2016 0802   LDLCALC 75 07/15/2016 0802   Hepatic Function Panel     Component Value Date/Time   PROT 6.7 07/15/2016 0802   ALBUMIN 4.2 07/15/2016 0802   AST 17 07/15/2016 0802   ALT 15 07/15/2016 0802   ALKPHOS 77  07/15/2016 0802   BILITOT 0.3 07/15/2016 0802      Component Value Date/Time   TSH 0.808 07/15/2016 0802   TSH 7.140 (H) 02/13/2016 0947   Results for Bevens, Jennifer LeatherwoodKATHERINE (MRN [id number]) as of 10/01/2017 09:32  Ref. Range 07/15/2016 08:02  Vitamin D, 25-Hydroxy Latest Ref Range: 30.0 - 100.0 ng/mL 44.1   ASSESSMENT AND PLAN: Vitamin D deficiency - Plan: Vitamin D, Ergocalciferol, (DRISDOL) 50000 units CAPS capsule  Other depression - with emotional eating - Plan: topiramate (TOPAMAX) 50 MG tablet  At risk for heart disease  Class 1 obesity with serious comorbidity and body mass index (BMI) of 33.0 to 33.9 in adult, unspecified obesity type  PLAN:  Vitamin D Deficiency Jennifer LeatherwoodKatherine was informed that low vitamin D levels contributes to fatigue and are associated with obesity, breast, and colon cancer. She agrees to continue to take prescription Vit D @50 ,000 IU every week #4 with no refills and will follow up for routine testing of vitamin D, at least 2-3 times per year. She was informed of the risk of over-replacement of vitamin D and agrees to not increase her dose unless she discusses this with us first. We will recheck labs in 1 month and Jennifer LeatherwoodKatherine agrees to follow up as directed.  Cardiovascular risk counseling Jennifer LeatherwoodKatherine was given extended (15 minutes) coronary artery disease prevention counseling today. She is 57 y.o. female and has risk factors for heart disease including obesity. We discussed intensive lifestyle modifications today with an emphasis on specific weight loss instructions and strategies. Pt was also informed of the importance of increasing exercise and decreasing saturated fats to help prevent heart disease.  Depression with Emotional Eating Behaviors We discussed behavior modification techniques today to help Jennifer LeatherwoodKatherine deal with her emotional eating and depression. She has agreed to start Topamax 50 mg qhs #30 with no refills and continue with Dr. Dewaine CongerBarker. Jennifer LeatherwoodKatherine agreed  to follow up with our clinic in 2 to 3 weeks.  Obesity Jennifer LeatherwoodKatherine is currently in the action stage of change. As such, her goal is to continue with weight loss efforts She has agreed to keep a food journal with 1200 to 1400 calories and 75+ grams of protein  Jennifer LeatherwoodKatherine has been instructed to work up to a goal of 150 minutes of combined cardio and strengthening exercise per week for weight loss and overall health benefits. We discussed the following Behavioral Modification Strategies today: increasing lean protein intake, work on meal planning and easy cooking plans and emotional eating strategies  Jennifer LeatherwoodKatherine has agreed to follow up with our clinic in 2 to 3 weeks. She was informed of the importance of frequent follow up visits to maximize her success with intensive lifestyle modifications for her multiple health conditions.   OBESITY BEHAVIORAL INTERVENTION VISIT  Today's visit was # 20   Starting weight: 223 lbs Starting date: 02/13/16 Today's weight : 227 lbs  Today's date: 10/01/2017 Total lbs lost to date: 4 At least 15 minutes were spent on discussing the following behavioral intervention visit.   ASK: We discussed the diagnosis of obesity with Janeann ForehandKatherine Riley today and Jennifer LeatherwoodKatherine agreed to give us permission to discuss obesity behavioral modification therapy today.  ASSESS: Jennifer LeatherwoodKatherine has the diagnosis of obesity and her BMI today is 33.51 Jennifer LeatherwoodKatherine is in the action stage of change   ADVISE: Jennifer LeatherwoodKatherine was educated on the multiple health risks of obesity as well as the benefit of weight loss to improve her health. She was advised of the need for long term treatment and the importance of lifestyle modifications to improve her current health and to decrease her risk of future health problems.  AGREE: Multiple dietary modification options and treatment options were discussed and  Jennifer LeatherwoodKatherine agreed to follow the recommendations documented in the above note.  ARRANGE: Jennifer LeatherwoodKatherine was  educated on the importance of frequent visits to treat obesity as outlined per CMS and USPSTF guidelines and agreed to schedule her next follow up appointment today.  Cristi LoronI, Joanne Murray, am acting as transcriptionist for Quillian Quincearen Beasley, MD  I have  reviewed the above documentation for accuracy and completeness, and I agree with the above. -Dennard Nip, MD

## 2017-10-07 ENCOUNTER — Encounter (INDEPENDENT_AMBULATORY_CARE_PROVIDER_SITE_OTHER): Payer: Self-pay

## 2017-10-08 MED FILL — SYNTHROID 150 MCG TABLET: 150 | 90 days supply | Qty: 90 | Fill #0

## 2017-10-08 NOTE — Progress Notes (Signed)
Office: [phone]  /  Fax: [phone]   Date: October 14, 2017 Time Seen: 4:30pm Duration: 30 minutes Provider: Lawerance Cruel, Psy.D. Type of Session: Individual Therapy   HPI: Katherinewas referred by Dr. Quillian Quince. Per the note for theofficevisit with Dr.Beasleyon August 25, 2017,"She is off track and feeling frustrated and guilty. She has been on Phentermine, but it has caused insomnia. She did well on Saxenda in the past, but has increased emotional and stress eating. She now realizes she uses food for [place] and eats to decrease negative emotions. Herweight is 227 lb (103 kg)today and has not lost weightsince herlast visit. Shehas gained 4 lbs since starting treatment with [place]." Per the note for the initial visit with this provider on September 15, 2017, Jennifer Riley shared, "I eat when I am not hungry and it is mainly at home in the evening." She reported she was prescribed Saxenda, which she reports works. Jennifer Riley reported, "Occasionally when I overeat, I will purge a little, but that is rare." The last time she purged was a couple weeks ago.Katherinewas asked to complete a questionnaire assessing various behaviors related to emotional eating. Katherineendorsed the following: experience food cravings on a regular basis; eat certain foods when you are anxious, stressed, depressed, or your feelings are hurt; use food to help you cope with emotional situations; find food is comforting to you; overeat when you are worried about something; overeat frequently when you are bored or lonely; and overeat when you are alone, but eat much less when you are with other people. Moreover, per the note for the initial visit with [facility] February 13, 2016,"states she eats out three times a week. She considers herself a picky eater. Jennifer Riley tends to snack on sweets to include chocolate, cookies, and ice cream. Jennifer Riley states she is trying to become a vegetaarin. Jennifer Riley states she eats  until she feels stuffed. She is an Surveyor, quantity. 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 She denied ever being diagnosed with an eating disorder. During today's appointment, Jennifer Riley shared a decrease in emotional eating secondary to awareness of emotional versus physical hunger.    Session Content: Session focused on the following treatment goal: decrease emotional eating. The session was initiated with the administration of the PHQ-9 and GAD-7, as well as a brief check-in. The provider shared the decrease in PHQ-9 and GAD-7 scores with Jennifer Riley. She attributed the reduction in scores to engagement in pleasurable activities. Jennifer Riley explained, "I really took to heart doing things for myself" and she described "feeling good." Jennifer Riley shared engaging in the following pleasurable activities since the last appointment: bubble baths, playing with dogs, audio books, and re-organizing her sewing room. The provider then explored the impact of engagement in pleasurable activities on eating. Jennifer Riley shared, "Even though I have emotionally eaten, it was not nearly as much as before." Jennifer Riley described using the first handout about physical versus emotional hunger and shared it has been "empowering." The provider also checked in with Jennifer Riley regarding her visit with her mother and daughter, Jennifer Riley reported, "It was really good." She described engaging in assertive communication and indicated the visit did not trigger emotional eating. Psychoeducation regarding triggers for emotional eating was provided. Jennifer Riley was provided a handout, and encouraged to utilize the handout between now and the next appointment to increase awareness of triggers  and frequency. Jennifer LeatherwoodKatherine agreed. Overall,  Jennifer LeatherwoodKatherine was receptive to today's session as evidenced by sharing about recent events and utilization of learned skills. She was open to sharing and responsive to feedback.   Mental Status Examination: Jennifer LeatherwoodKatherine arrived on time for the appointment. She presented as appropriately dressed and groomed. Jennifer LeatherwoodKatherine appeared her stated age and demonstrated adequate orientation to time, place, person, and purpose of the appointment. She also demonstrated appropriate eye contact. No psychomotor abnormalities or behavioral peculiarities noted. Her mood was euthymic with congruent affect. Her thought processes were logical, linear, and goal-directed. No hallucinations, delusions, bizarre thinking or behavior reported or observed. Judgment, insight, and impulse control appeared to be grossly intact. There was no evidence of paraphasias (i.e., errors in speech, gross mispronunciations, and word substitutions), repetition deficits, or disturbances in volume or prosody (i.e., rhythm and intonation). There was no evidence of attention or memory impairments. Jennifer LeatherwoodKatherine denied current suicidal and homicidal ideation, intent or plan.  Structured Assessment Results: The Patient Health Questionnaire-9 (PHQ-9) is a self-report measure that assesses symptoms and severity of depression over the course of the last two weeks. Jennifer LeatherwoodKatherine obtained a score of one suggesting minimal depression. Jennifer LeatherwoodKatherine finds the endorsed symptoms to be not difficult at all. [facility] screen [facility] 2[facility] 10/14/2017  Decreased Interest 0  Down, Depressed, Hopeless 0  PHQ - 2 Score 0  Altered sleeping 0  Tired, decreased energy 0  Change in appetite 1  Feeling bad or failure about yourself  0  Trouble concentrating 0  Moving slowly or fidgety/restless 0  Suicidal thoughts 0  PHQ-9 Score 1   The Generalized Anxiety Disorder-7 (GAD-7) is a brief self-report measure that assesses symptoms of anxiety over the course of the last two weeks. Jennifer LeatherwoodKatherine  obtained a score of one suggesting minimal anxiety. GAD 7 : Generalized Anxiety Score 10/14/2017  Nervous, Anxious, on Edge 0  Control/stop worrying 1  Worry too much - different things 0  Trouble relaxing 0  Restless 0  Easily annoyed or irritable 0  Afraid - awful might happen 0  Total GAD 7 Score 1  Anxiety Difficulty Not difficult at all   Interventions: Jennifer LeatherwoodKatherine was administered the PHQ-9 and GAD-7 for symptom monitoring. Content from the last session was reviewed. Throughout today's session, empathic reflections and validation were provided. Psychoeducation regarding triggers for emotional eating was provided. The provider also explained the impact of engagement in pleasurable activities when triggered.  Treatment Goal & Progress: During the initial appointment with this provider, the goal of decreasing emotional eating was established. Jennifer LeatherwoodKatherine has shown progress in her goal of decreasing emotional eating as evidenced by an increase in awareness of hunger patterns and engagement in pleasurable activities. Jennifer LeatherwoodKatherine also verbalized a decrease in emotional eating since the last appointment.   DSM-5 Diagnosis: 296.32 (F33.1) Major Depressive Disorder, Recurrent Episode, Moderate, With Anxious Distress  Plan: Jennifer LeatherwoodKatherine continues to appear able and willing to participate as evidenced by engagement in reciprocal conversation, and asking questions for clarification as appropriate. The next appointment will be scheduled in two weeks. The next session will focus on reviewing triggers for emotional eating and working towards the established treatment goal.

## 2017-10-14 ENCOUNTER — Ambulatory Visit (INDEPENDENT_AMBULATORY_CARE_PROVIDER_SITE_OTHER): Payer: Self-pay | Admitting: Bariatrics

## 2017-10-14 ENCOUNTER — Ambulatory Visit: Payer: 59

## 2017-10-14 ENCOUNTER — Ambulatory Visit (INDEPENDENT_AMBULATORY_CARE_PROVIDER_SITE_OTHER): Payer: 59 | Admitting: Psychology

## 2017-10-14 ENCOUNTER — Encounter (INDEPENDENT_AMBULATORY_CARE_PROVIDER_SITE_OTHER): Payer: Self-pay

## 2017-10-14 DIAGNOSIS — I8393 Asymptomatic varicose veins of bilateral lower extremities: Secondary | ICD-10-CM | POA: Insufficient documentation

## 2017-10-14 DIAGNOSIS — F331 Major depressive disorder, recurrent, moderate: Secondary | ICD-10-CM

## 2017-10-14 NOTE — Progress Notes (Signed)
0.3% Sotradecol administered with a 27g butterfly.  Patient received a total of 12 mL.  Cutaneous Laser:pulsed mode 592j/cm2 400 ms delay  10 ms Duration 0.5 spot  Total pulses: 288 Total energy 331.89J  Total time:73minutes  Photos: No.  Compression stockings applied: Yes.     Pt. Treated for bilateral leg spider and reticular veins with 0.3% Sotradecol. Treated small nasal capillaries with cutaneous laser. Expect good results for both cutaneous laser and sclerotherapy. Post procedural instructions provided both verbally and with handouts. Will follow PRN.

## 2017-10-23 NOTE — Progress Notes (Signed)
Office: [phone]  /  Fax: [phone]   Date: October 29, 2017 Time Seen: 4:30pm Duration: 32 minutes Provider: Lawerance CruelGaytri Lenardo Westwood, Psy.D. Type of Session: Individual Therapy   HPI:  Jennifer Riley was referred by Dr. Quillian Quincearen Beasley was seen for an initial appointment with this provider on September 15, 2017. Per the note for the office visit with Dr. Dalbert GarnetBeasley on August 25, 2017, "She is off track and feeling frustrated and guilty. She has been on Phentermine, but it has caused insomnia. She did well on Saxenda in the past, but has increased emotional and stress eating. She now realizes she uses food for [place] and eats to decrease negative emotions. Her weight is 227 lb (103 kg) today and has not lost weight since her last visit. She has gained 4 lbs since starting treatment with us." Per the note for the initial visit with this provider on September 15, 2017, Jennifer Riley shared, "I eat when I am not hungry and it is mainly at home in the evening." She reported she was prescribed Saxenda, which she reports works. Jennifer Riley reported, "Occasionally when I overeat, I will purge a little, but that is rare." The last time she purged was a couple weeks ago. Jennifer Riley was asked to complete a questionnaire assessing various behaviors related to emotional eating. Jennifer Riley endorsed the following: experience food cravings on a regular basis; eat certain foods when you are anxious, stressed, depressed, or your feelings are hurt; use food to help you cope with emotional situations; find food is comforting to you; overeat when you are worried about something; overeat frequently when you are bored or lonely; and overeat when you are alone, but eat much less when you are with other people. Moreover, per the note for the initial visit with Dr. Dalbert GarnetBeasley on February 13, 2016, Jennifer Riley "states she eats out three times a week. She considers herself a picky eater. Jennifer Riley tends to snack on sweets to include chocolate, cookies, and ice  cream. Jennifer Riley states she is trying to become a vegetaarin. Jennifer Riley states she eats until she feels stuffed. She is an Surveyor, quantityemotional eater. Jennifer Riley states she is a binge eater." Sherryl's Food and Mood (modified PHQ-9) score during the initial visit was 21. During the initial visit with this provider, Jennifer Riley reported emotional eating started when she quit drinking alcohol almost 20 years ago. She denied ever being diagnosed with an eating disorder. During today's appointment, Jennifer Riley reported one episode of emotional eating since the last appointment.   Session Content: Session focused on the following treatment goal: decrease emotional eating. The session was initiated with the administration of the PHQ-9 and GAD-7, as well as a brief check-in. Information concerning the practice, financial arrangements, and confidentiality and patients' rights were discussed during the initial appointment; however, per this provider's new office policy, Jennifer Riley was asked to sign a service agreement related to the aforementioned. The agreement was reviewed with, and signed by, Jennifer Riley. The decrease in PHQ-9 and GAD-7 scores was shared with Jennifer Riley. She stated, "I think our sessions have been really helpful." Regarding triggers for emotional eating, Jennifer Riley reported, "I think I was aware of my triggers." She reported continuing to engage in pleasurable activities, and noted, "It's helped a lot." Since the last appointment, Jennifer Riley reported one episode of emotional eating secondary to frustration related to her air conditioner not working. She noted she had "three ice cream cones" due to anger and also being hot. This episode was processed. Remainder of session focused on mindfulness. Psychoeducation regarding mindfulness was  provided, including its impact on eating. Jennifer Riley was provided a handout and encouraged to engage in two of the activities noted on the handout. Jennifer Riley agreed. Overall, Jennifer Riley was  receptive to today's session as evidenced by openness to sharing, responsiveness to feedback, and willingness to engage in mindfulness.  Mental Status Examination: Jennifer Riley arrived on time for the appointment. She presented as appropriately dressed and groomed. Jennifer Riley appeared her stated age and demonstrated adequate orientation to time, place, person, and purpose of the appointment. She also demonstrated appropriate eye contact. No psychomotor abnormalities or behavioral peculiarities noted. Her mood was euthymic with congruent affect. Her thought processes were logical, linear, and goal-directed. No hallucinations, delusions, bizarre thinking or behavior reported or observed. Judgment, insight, and impulse control appeared to be grossly intact. There was no evidence of paraphasias (i.e., errors in speech, gross mispronunciations, and word substitutions), repetition deficits, or disturbances in volume or prosody (i.e., rhythm and intonation). There was no evidence of attention or memory impairments. Jennifer Riley denied current suicidal and homicidal ideation, intent or plan.  Structured Assessment Results: The Patient Health Questionnaire-9 (PHQ-9) is a self-report measure that assesses symptoms and severity of depression over the course of the last two weeks. Jennifer Riley obtained a score of zero. Depression screen [facility] 2/9 10/29/2017  Decreased Interest 0  Down, Depressed, Hopeless 0  PHQ - 2 Score 0  Altered sleeping 0  Tired, decreased energy 0  Change in appetite 0  Feeling bad or failure about yourself  0  Trouble concentrating 0  Moving slowly or fidgety/restless 0  Suicidal thoughts 0  PHQ-9 Score 0   The Generalized Anxiety Disorder-7 (GAD-7) is a brief self-report measure that assesses symptoms of anxiety over the course of the last two weeks. Jennifer Riley obtained a score of zero. GAD 7 : Generalized Anxiety Score 10/29/2017  Nervous, Anxious, on Edge 0  Control/stop worrying 0  Worry too  much - different things 0  Trouble relaxing 0  Restless 0  Easily annoyed or irritable 0  Afraid - awful might happen 0  Total GAD 7 Score 0  Anxiety Difficulty Not difficult at all   Interventions: Jennifer Riley was administered the PHQ-9 and GAD-7 for symptom monitoring. Content from the last session (I.e., triggers for emotional eating) was reviewed. Throughout today's session, empathic reflections and validation were provided. Psychoeducation regarding mindfulnes was provided and a handout was given.   DSM-5 Diagnosis: 296.32 (F33.1) Major Depressive Disorder, Recurrent Episode, Moderate, With Anxious Distress  Treatment Goal & Progress: Jennifer Riley was seen for an initial appointment with this provider on September 15, 2017 during which the following treatment goal was established: decrease emotional eating. Jennifer Riley has demonstrated progress in her goal of decreasing emotional eating as evidenced by an increase in awareness of hunger patterns and triggers for emotional eating. Per self-report, Jennifer Riley engaged in one episode of emotional eating since the last appointment.   Plan: Jennifer Riley continues to appear able and willing to participate as evidenced by engagement in reciprocal conversation, and asking questions for clarification as appropriate. The next appointment will be scheduled in two weeks. The next session will focus on reviewing mindfulness and the introduction of thought defusion. The next appointment will also focus on termination planning.

## 2017-10-28 ENCOUNTER — Ambulatory Visit (INDEPENDENT_AMBULATORY_CARE_PROVIDER_SITE_OTHER): Payer: Self-pay | Admitting: Psychology

## 2017-10-29 ENCOUNTER — Ambulatory Visit (INDEPENDENT_AMBULATORY_CARE_PROVIDER_SITE_OTHER): Payer: 59 | Admitting: Family Medicine

## 2017-10-29 ENCOUNTER — Ambulatory Visit (INDEPENDENT_AMBULATORY_CARE_PROVIDER_SITE_OTHER): Payer: 59 | Admitting: Psychology

## 2017-10-29 VITALS — Ht 69.0 in | Wt 217.0 lb

## 2017-10-29 DIAGNOSIS — F331 Major depressive disorder, recurrent, moderate: Secondary | ICD-10-CM | POA: Diagnosis not present

## 2017-10-29 DIAGNOSIS — Z9189 Other specified personal risk factors, not elsewhere classified: Secondary | ICD-10-CM

## 2017-10-29 DIAGNOSIS — E669 Obesity, unspecified: Secondary | ICD-10-CM

## 2017-10-29 DIAGNOSIS — E559 Vitamin D deficiency, unspecified: Secondary | ICD-10-CM | POA: Diagnosis not present

## 2017-10-29 DIAGNOSIS — F3289 Other specified depressive episodes: Secondary | ICD-10-CM | POA: Diagnosis not present

## 2017-10-29 DIAGNOSIS — Z6832 Body mass index (BMI) 32.0-32.9, adult: Secondary | ICD-10-CM | POA: Diagnosis not present

## 2017-10-29 MED ORDER — VORTIOXETINE HBR 20 MG PO TABS
20.0000 mg | ORAL_TABLET | Freq: Every morning | ORAL | 0 refills | Status: AC
Start: 2017-10-29 — End: ?

## 2017-10-29 MED ORDER — LIRAGLUTIDE -WEIGHT MANAGEMENT 18 MG/3ML ~~LOC~~ SOPN: 3.0000 mg | PEN_INJECTOR | Freq: Every day | SUBCUTANEOUS | 0 refills | Status: AC

## 2017-10-29 MED ORDER — TOPIRAMATE 50 MG PO TABS: 50.0000 mg | ORAL_TABLET | Freq: Two times a day (BID) | ORAL | 0 refills | Status: AC

## 2017-10-29 MED ORDER — VITAMIN D (ERGOCALCIFEROL) 1.25 MG (50000 UNIT) PO CAPS: 50000.0000 [IU] | ORAL_CAPSULE | ORAL | 0 refills | Status: AC

## 2017-10-30 MED FILL — TOPIRAMATE 50 MG TABLET: 50 | 15 days supply | Qty: 30 | Fill #0

## 2017-10-30 MED FILL — TRINTELLIX 20 MG TABLET: 20 | 30 days supply | Qty: 30 | Fill #0

## 2017-10-30 MED FILL — VIT D2 1.25 MG (50,000 UNIT: 1.25 MG | 28 days supply | Qty: 4 | Fill #0

## 2017-10-30 MED FILL — SAXENDA 18 MG/3 ML PEN: 18 | 30 days supply | Qty: 15 | Fill #0

## 2017-11-03 NOTE — Progress Notes (Signed)
Office: [phone]  /  Fax: [phone]   HPI:   Chief Complaint: OBESITY Jennifer Riley is here to discuss her progress with her obesity treatment plan. She is on the keep a food journal with 1200-1400 calories and 75+ grams of protein daily and is following her eating plan approximately 95 % of the time. She states she is walking for 10 minutes 4 times per week. Jennifer Riley has done weill with weight loss. Her hunger is controlled on Saxenda at 1.2 mg q daily. She is journaling and working on increasing protein goal.  Her weight is 217 lb (98.4 kg) today and has had a weight loss of 10 pounds over a period of 4 weeks since her last visit. She has lost 6 lbs since starting treatment with [place].  Vitamin D Deficiency Jennifer Riley has a diagnosis of vitamin D deficiency. She is stable on prescription Vit D, but level is not yet at goal. She denies nausea, vomiting or muscle weakness.  Depression with emotional eating behaviors Jennifer Riley is seeing Dr. Dewaine Conger for therapy and she feels she is more in control of her emotional eating. Her mood is good, she is sleeping better, and she wonders if topiramate can be increased to help more with sleeping. Jennifer Riley struggles with emotional eating and using food for [place] to the extent that it is negatively impacting her health. She often snacks when she is not hungry. Jennifer Riley sometimes feels she is out of control and then feels guilty that she made poor food choices. She has been working on behavior modification techniques to help reduce her emotional eating and has been somewhat successful. She shows no sign of suicidal or homicidal ideations.  Depression screen [facility] 2/9 10/29/2017 10/14/2017 09/30/2017 09/15/2017 02/13/2016  Decreased Interest 0 0 1 2 3   Down, Depressed, Hopeless 0 0 1 2 3   PHQ - 2 Score 0 0 2 4 6   Altered sleeping 0 0 2 3 3   Tired, decreased energy 0 0 2 3 3   Change in appetite 0 1 2 3 3   Feeling bad or failure about yourself  0 0 1 1 2   Trouble  concentrating 0 0 2 2 2   Moving slowly or fidgety/restless 0 0 0 0 2  Suicidal thoughts 0 0 0 1 1  PHQ-9 Score 0 1 11 17 22    At risk for cardiovascular disease Jennifer Riley is at a higher than average risk for cardiovascular disease due to obesity. She currently denies any chest pain.  ALLERGIES: Allergies  Allergen Reactions  . Wellbutrin [Bupropion]     delusion    MEDICATIONS: Current Outpatient Medications on File Prior to Visit  Medication Sig Dispense Refill  . ALPRAZolam (XANAX) 0.25 MG tablet Take 0.25 mg by mouth.    . Insulin Pen Needle (BD PEN NEEDLE NANO 2ND GEN) 32G X 4 MM MISC 1 Package by Does not apply route 2 (two) times daily. 100 each 0  . levothyroxine (SYNTHROID, LEVOTHROID) 137 MCG tablet Take 137 mcg by mouth daily before breakfast.    . tretinoin (RETIN-A) 0.025 % cream Apply topically at bedtime.     No current facility-administered medications on file prior to visit.     PAST MEDICAL HISTORY: Past Medical History:  Diagnosis Date  . Anxiety   . Constipation   . Depression   . Gallbladder problem   . Hypothyroidism   . Insomnia   . Joint pain   . Obesity     PAST SURGICAL HISTORY: Past Surgical History:  Procedure Laterality Date  . GALLBLADDER SURGERY     06/2001  . TUBAL LIGATION     03/19/1990    SOCIAL HISTORY: Social History   Tobacco Use  . Smoking status: Former Smoker    Last attempt to quit: 02/12/1993    Years since quitting: 24.7  . Smokeless tobacco: Never Used  Substance Use Topics  . Alcohol use: Not on file  . Drug use: Not on file    FAMILY HISTORY: Family History  Problem Relation Age of Onset  . Alcohol abuse Mother   . Cancer Father   . Alcohol abuse Father   . Kidney disease Father     ROS: Review of Systems  Constitutional: Positive for weight loss.  Cardiovascular: Negative for chest pain.  Gastrointestinal: Negative for nausea and vomiting.  Musculoskeletal:       Negative muscle weakness    Psychiatric/Behavioral: Positive for depression. Negative for suicidal ideas.    PHYSICAL EXAM: Height 5\' 9"  (1.753 m), weight 217 lb (98.4 kg). Body mass index is 32.05 kg/m. Physical Exam  Constitutional: She is oriented to person, place, and time. She appears well-developed and well-nourished.  Cardiovascular: Normal rate.  Pulmonary/Chest: Effort normal.  Musculoskeletal: Normal range of motion.  Neurological: She is oriented to person, place, and time.  Skin: Skin is warm and dry.  Psychiatric: She has a normal mood and affect. Her behavior is normal.  Vitals reviewed.   RECENT LABS AND TESTS: BMET    Component Value Date/Time   NA 143 07/15/2016 0802   K 4.7 07/15/2016 0802   CL 106 07/15/2016 0802   CO2 24 07/15/2016 0802   GLUCOSE 90 07/15/2016 0802   BUN 15 07/15/2016 0802   CREATININE 1.00 07/15/2016 0802   CALCIUM 9.3 07/15/2016 0802   GFRNONAA 64 07/15/2016 0802   GFRAA 74 07/15/2016 0802   Lab Results  Component Value Date   HGBA1C 5.6 07/15/2016   HGBA1C 5.7 (H) 02/13/2016   Lab Results  Component Value Date   INSULIN 15.6 07/15/2016   INSULIN 20.1 02/13/2016   CBC    Component Value Date/Time   WBC 7.0 02/13/2016 0947   RBC 5.06 02/13/2016 0947   HGB 15.2 02/13/2016 0947   HCT 44.7 02/13/2016 0947   MCV 88 02/13/2016 0947   MCH 30.0 02/13/2016 0947   MCHC 34.0 02/13/2016 0947   RDW 13.7 02/13/2016 0947   LYMPHSABS 2.1 02/13/2016 0947   EOSABS 0.1 02/13/2016 0947   BASOSABS 0.0 02/13/2016 0947   Iron/TIBC/Ferritin/ %Sat No results found for: IRON, TIBC, FERRITIN, IRONPCTSAT Lipid Panel     Component Value Date/Time   CHOL 135 07/15/2016 0802   TRIG 109 07/15/2016 0802   HDL 38 (L) 07/15/2016 0802   LDLCALC 75 07/15/2016 0802   Hepatic Function Panel     Component Value Date/Time   PROT 6.7 07/15/2016 0802   ALBUMIN 4.2 07/15/2016 0802   AST 17 07/15/2016 0802   ALT 15 07/15/2016 0802   ALKPHOS 77 07/15/2016 0802   BILITOT 0.3  07/15/2016 0802      Component Value Date/Time   TSH 0.808 07/15/2016 0802   TSH 7.140 (H) 02/13/2016 0947  Results for Jennifer Riley, Jennifer Riley (MRN [id number]) as of 11/03/2017 13:35  Ref. Range 07/15/2016 08:02  Vitamin D, 25-Hydroxy Latest Ref Range: 30.0 - 100.0 ng/mL 44.1    ASSESSMENT AND PLAN: Vitamin D deficiency - Plan: Vitamin D, Ergocalciferol, (DRISDOL) 50000 units CAPS capsule  Other depression - with emotional  eating - Plan: topiramate (TOPAMAX) 50 MG tablet, vortioxetine HBr (TRINTELLIX) 20 MG TABS tablet  At risk for heart disease  Class 1 obesity with serious comorbidity and body mass index (BMI) of 32.0 to 32.9 in adult, unspecified obesity type - Plan: Liraglutide -Weight Management (SAXENDA) 18 MG/3ML SOPN  PLAN:  Vitamin D Deficiency Jennifer Riley was informed that low vitamin D levels contributes to fatigue and are associated with obesity, breast, and colon cancer. Jennifer Riley agrees to continue taking prescription Vit D @50 ,000 IU every week #4 and we will refill for 1 month. She will follow up for routine testing of vitamin D, at least 2-3 times per year. She was informed of the risk of over-replacement of vitamin D and agrees to not increase her dose unless she discusses this with [place] first. Jennifer Riley agrees to follow up with our clinic in 2 weeks with Eber Jones, our registered dietitian and Dr. Dewaine Conger, our bariatric psychologist, and in 4 weeks with myself.  Depression with Emotional Eating Behaviors We discussed behavior modification techniques today to help Jennifer Riley deal with her emotional eating and depression. Jennifer Riley agrees to continue taking topiramate 50 mg BID #60 and we will refill for 1 month, and she agrees to continue taking Trintellix 20 mg (Pt is to take 1 1/2 tablet qhs) #60 and we will refill for 1 month. Jennifer Riley agrees to follow up with our clinic in 2 weeks with Eber Jones, our registered dietitian and Dr. Dewaine Conger, our bariatric psychologist, and in 4 weeks with  myself.  Cardiovascular risk counselling Jennifer Riley was given extended (15 minutes) coronary artery disease prevention counseling today. She is 57 y.o. female and has risk factors for heart disease including obesity. We discussed intensive lifestyle modifications today with an emphasis on specific weight loss instructions and strategies. Pt was also informed of the importance of increasing exercise and decreasing saturated fats to help prevent heart disease.  Obesity Jennifer Riley is currently in the action stage of change. As such, her goal is to continue with weight loss efforts She has agreed to keep a food journal with 1200-1400 calories and 75+ grams of protein daily Jennifer Riley has been instructed to work up to a goal of 150 minutes of combined cardio and strengthening exercise per week for weight loss and overall health benefits. We discussed the following Behavioral Modification Strategies today: increasing lean protein intake, decreasing simple carbohydrates  and work on meal planning and easy cooking plans We discussed various medication options to help Jennifer Riley with her weight loss efforts and we both agreed to continue Saxenda 3 mg SubQ daily #5 pens and we will refill for 1 month (Pt is to continue at 1.2 mg daily).  Jennifer Riley has agreed to follow up with our clinic in 2 weeks with Eber Jones, our registered dietitian and Dr. Dewaine Conger, our bariatric psychologist, and in 4 weeks with myself. She was informed of the importance of frequent follow up visits to maximize her success with intensive lifestyle modifications for her multiple health conditions.   OBESITY BEHAVIORAL INTERVENTION VISIT  Today's visit was # 21   Starting weight: 223 lbs Starting date: 02/13/16 Today's weight : 217 lbs  Today's date: 10/29/2017 Total lbs lost to date: 6    ASK: We discussed the diagnosis of obesity with [name] today and Jennifer Riley agreed to give [place] permission to discuss obesity behavioral  modification therapy today.  ASSESS: Jennifer Riley has the diagnosis of obesity and her BMI today is 32.03 Jennifer Riley is in the action stage of change  ADVISE: Jennifer Riley was educated on the multiple health risks of obesity as well as the benefit of weight loss to improve her health. She was advised of the need for long term treatment and the importance of lifestyle modifications to improve her current health and to decrease her risk of future health problems.  AGREE: Multiple dietary modification options and treatment options were discussed and  Jennifer Riley agreed to follow the recommendations documented in the above note.  ARRANGE: Jennifer Riley was educated on the importance of frequent visits to treat obesity as outlined per CMS and USPSTF guidelines and agreed to schedule her next follow up appointment today.  I, Burt Knack, am acting as transcriptionist for Quillian Quince, MD  I have reviewed the above documentation for accuracy and completeness, and I agree with the above. -Quillian Quince, MD

## 2017-11-06 NOTE — Progress Notes (Unsigned)
Office: [phone]  /  Fax: [phone]   Date: November 12, 2017 Time Seen:*** Duration:*** Provider: Lawerance Riley, Psy.D. Type of Session: Individual Therapy   HPI: Jennifer Riley referred by Dr. Quillian Riley was seen for an initial appointment with this provider on September 15, 2017. Per the note for theofficevisit with Dr.Beasleyon August 25, 2017,"She is off track and feeling frustrated and guilty. She has been on Phentermine, but it has caused insomnia. She did well on Saxenda in the past, but has increased emotional and stress eating. She now realizes she uses food for [place] and eats to decrease negative emotions. Herweight is 227 lb (103 kg)today and has not lost weightsince herlast visit. Shehas gained 4 lbs since starting treatment with [place]." Moreover, Jennifer Riley,"states she eats out three times a week. She considers herself a picky eater. Jennifer Riley tends to snack on sweets to include chocolate, cookies, and ice cream. Jennifer Riley states she is trying to become a vegetaarin. Jennifer Riley states she eats until she feels stuffed. She is an Surveyor, quantity. Jennifer Riley states she is a binge eater."[name]'s Food and Mood (modified PHQ-9) scoreduring the initial visit was 21.In addition, during the initial appointment with this provider, Jennifer Riley reported, "I eat when I am not hungry and it is mainly at home in the evening." She reported she was prescribed Saxenda, which she reports works. Jennifer Riley stated, "Occasionally when I overeat, I will purge a little, but that is rare." The last time she purged was a couple weeks prior to the initial appointment with this provider.Jennifer Riley asked to complete a questionnaire assessing various behaviors related to emotional eating. Katherineendorsed the following: experience food cravings on a regular basis; eat certain foods when you are anxious, stressed, depressed, or your feelings are hurt; use food to help you cope with emotional situations;  find food is comforting to you; overeat when you are worried about something; overeat frequently when you are bored or lonely; and overeat when you are alone, but eat much less when you are with other people. Furthermore,  Jennifer Riley reported emotional eating started when she quit drinking alcohol almost 20 years ago. She denied ever being diagnosed with an eating disorder.  Session Content: Session focused on the following treatment goal: decrease emotional eating. The session was initiated with the administration of the PHQ-9 and GAD-7, as well as a brief check-in.   Jennifer Riley was receptive to today's session as evidenced by ***.  Mental Status Examination: Jennifer Riley arrived on time for the appointment. She presented as appropriately dressed and groomed. Jennifer Riley appeared her stated age and demonstrated adequate orientation to time, place, person, and purpose of the appointment. She also demonstrated appropriate eye contact. No psychomotor abnormalities or behavioral peculiarities noted. Her mood was {gbmood:21757} with congruent affect. Her thought processes were logical, linear, and goal-directed. No hallucinations, delusions, bizarre thinking or behavior reported or observed. Judgment, insight, and impulse control appeared to be grossly intact. There was no evidence of paraphasias (i.e., errors in speech, gross mispronunciations, and word substitutions), repetition deficits, or disturbances in volume or prosody (i.e., rhythm and intonation). There was no evidence of attention or memory impairments. Jennifer Riley denied current suicidal and homicidal ideation, intent or plan.  Structured Assessment Results: The Patient Health Questionnaire-9 (PHQ-9) is a self-report measure that assesses symptoms and severity of depression over the course of the last two weeks. Jennifer Riley obtained a score of *** suggesting {GBPHQ9SEVERITY:21752}. Jennifer Riley finds the endorsed symptoms to be {gbphq9difficulty:21754}.  The  Generalized Anxiety Disorder-7 (GAD-7) is a brief self-report measure that  assesses symptoms of anxiety over the course of the last two weeks. Jennifer Riley obtained a score of *** suggesting {gbgad7severity:21753}.  Interventions: Jennifer Riley was administered the PHQ-9 and GAD-7 for symptom monitoring. Content from the last session was reviewed. Throughout today's session, empathic reflections and validation were provided. Psychoeducation regarding *** was provided and *** [insert other interventions].   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 Jennifer Riley has demonstrated progress in her goal of reducing emotional eating as evidenced by ***  Plan: Jennifer Riley continues to appear able and willing to participate as evidenced by engagement in reciprocal conversation, and asking questions for clarification as appropriate.*** The next appointment will be scheduled in {gbweeks:21758}. The next session will focus on reviewing learned skills, and working towards the established treatment goal.***

## 2017-11-07 ENCOUNTER — Encounter (INDEPENDENT_AMBULATORY_CARE_PROVIDER_SITE_OTHER): Payer: Self-pay | Admitting: Family Medicine

## 2017-11-10 NOTE — Telephone Encounter (Signed)
Thank you,

## 2017-11-12 ENCOUNTER — Ambulatory Visit (INDEPENDENT_AMBULATORY_CARE_PROVIDER_SITE_OTHER): Payer: Self-pay | Admitting: Psychology

## 2017-11-12 ENCOUNTER — Ambulatory Visit (INDEPENDENT_AMBULATORY_CARE_PROVIDER_SITE_OTHER): Payer: Self-pay | Admitting: Dietician

## 2017-11-12 ENCOUNTER — Encounter (INDEPENDENT_AMBULATORY_CARE_PROVIDER_SITE_OTHER): Payer: Self-pay

## 2017-11-21 ENCOUNTER — Encounter (INDEPENDENT_AMBULATORY_CARE_PROVIDER_SITE_OTHER): Payer: Self-pay | Admitting: Family Medicine

## 2017-11-26 ENCOUNTER — Ambulatory Visit (INDEPENDENT_AMBULATORY_CARE_PROVIDER_SITE_OTHER): Payer: Self-pay | Admitting: Family Medicine

## 2017-12-02 ENCOUNTER — Ambulatory Visit (INDEPENDENT_AMBULATORY_CARE_PROVIDER_SITE_OTHER): Payer: Self-pay | Admitting: Family Medicine

## 2017-12-26 DIAGNOSIS — G43909 Migraine, unspecified, not intractable, without status migrainosus: Secondary | ICD-10-CM | POA: Diagnosis not present

## 2017-12-26 MED FILL — PROMETHAZINE 25 MG TABLET: 25 | 5 days supply | Qty: 20 | Fill #0

## 2017-12-26 MED FILL — SUMAtriptan SUCCINATE 50 MG: 50 | 30 days supply | Qty: 9 | Fill #0

## 2018-01-19 MED FILL — SYNTHROID 150 MCG TABLET: 150 | 30 days supply | Qty: 30 | Fill #0

## 2018-03-09 ENCOUNTER — Other Ambulatory Visit (INDEPENDENT_AMBULATORY_CARE_PROVIDER_SITE_OTHER): Payer: Self-pay | Admitting: Family Medicine

## 2018-03-09 DIAGNOSIS — F3289 Other specified depressive episodes: Secondary | ICD-10-CM

## 2018-03-09 MED FILL — SYNTHROID 150 MCG TABLET: 150 | 30 days supply | Qty: 30 | Fill #0

## 2018-03-20 DIAGNOSIS — E559 Vitamin D deficiency, unspecified: Secondary | ICD-10-CM | POA: Diagnosis not present

## 2018-03-20 DIAGNOSIS — M25559 Pain in unspecified hip: Secondary | ICD-10-CM | POA: Diagnosis not present

## 2018-03-20 DIAGNOSIS — E782 Mixed hyperlipidemia: Secondary | ICD-10-CM | POA: Diagnosis not present

## 2018-03-20 DIAGNOSIS — E7800 Pure hypercholesterolemia, unspecified: Secondary | ICD-10-CM | POA: Diagnosis not present

## 2018-03-20 DIAGNOSIS — G43909 Migraine, unspecified, not intractable, without status migrainosus: Secondary | ICD-10-CM | POA: Diagnosis not present

## 2018-03-20 DIAGNOSIS — L709 Acne, unspecified: Secondary | ICD-10-CM | POA: Diagnosis not present

## 2018-03-20 DIAGNOSIS — E039 Hypothyroidism, unspecified: Secondary | ICD-10-CM | POA: Diagnosis not present

## 2018-03-20 DIAGNOSIS — R7303 Prediabetes: Secondary | ICD-10-CM | POA: Diagnosis not present

## 2018-03-25 MED FILL — MELOXICAM 15 MG TABLET: 15 | 90 days supply | Qty: 90 | Fill #0

## 2018-03-25 MED FILL — SYNTHROID 175 MCG TABLET: 175 | 90 days supply | Qty: 90 | Fill #0

## 2018-04-02 ENCOUNTER — Ambulatory Visit: Payer: Self-pay | Admitting: Family Medicine

## 2018-04-14 ENCOUNTER — Other Ambulatory Visit (INDEPENDENT_AMBULATORY_CARE_PROVIDER_SITE_OTHER): Payer: Self-pay | Admitting: Family Medicine

## 2018-04-14 DIAGNOSIS — F3289 Other specified depressive episodes: Secondary | ICD-10-CM

## 2018-04-21 MED FILL — TRETINOIN 0.025% CREAM: 0.025 | 30 days supply | Qty: 20 | Fill #1

## 2018-04-28 ENCOUNTER — Ambulatory Visit: Payer: 59

## 2018-04-28 ENCOUNTER — Other Ambulatory Visit: Payer: Self-pay

## 2018-04-29 ENCOUNTER — Ambulatory Visit: Payer: Self-pay

## 2018-04-29 MED FILL — SUMAtriptan SUCCINATE 50 MG: 50 | 30 days supply | Qty: 9 | Fill #1

## 2018-06-03 DIAGNOSIS — F331 Major depressive disorder, recurrent, moderate: Secondary | ICD-10-CM | POA: Diagnosis not present

## 2018-06-04 DIAGNOSIS — G4710 Hypersomnia, unspecified: Secondary | ICD-10-CM | POA: Diagnosis not present

## 2018-06-08 DIAGNOSIS — F331 Major depressive disorder, recurrent, moderate: Secondary | ICD-10-CM | POA: Diagnosis not present

## 2018-06-09 DIAGNOSIS — R41840 Attention and concentration deficit: Secondary | ICD-10-CM | POA: Diagnosis not present

## 2018-06-09 DIAGNOSIS — L709 Acne, unspecified: Secondary | ICD-10-CM | POA: Diagnosis not present

## 2018-06-16 DIAGNOSIS — G4733 Obstructive sleep apnea (adult) (pediatric): Secondary | ICD-10-CM | POA: Diagnosis not present

## 2018-06-17 DIAGNOSIS — G4733 Obstructive sleep apnea (adult) (pediatric): Secondary | ICD-10-CM | POA: Diagnosis not present

## 2018-06-25 DIAGNOSIS — G4733 Obstructive sleep apnea (adult) (pediatric): Secondary | ICD-10-CM | POA: Diagnosis not present

## 2018-06-26 DIAGNOSIS — E039 Hypothyroidism, unspecified: Secondary | ICD-10-CM | POA: Diagnosis not present

## 2018-06-26 DIAGNOSIS — E559 Vitamin D deficiency, unspecified: Secondary | ICD-10-CM | POA: Diagnosis not present

## 2018-06-26 MED FILL — TRETINOIN 0.025% CREAM: 0.025 | 30 days supply | Qty: 20 | Fill #0

## 2018-06-26 MED FILL — SYNTHROID 175 MCG TABLET: 175 | 90 days supply | Qty: 90 | Fill #0

## 2018-06-30 MED FILL — SYNTHROID 150 MCG TABLET: 150 | 90 days supply | Qty: 45 | Fill #0

## 2018-06-30 MED FILL — VIT D2 1.25 MG (50,000 UNIT: 1.25 MG | 56 days supply | Qty: 8 | Fill #0

## 2018-07-16 DIAGNOSIS — F419 Anxiety disorder, unspecified: Secondary | ICD-10-CM | POA: Diagnosis not present

## 2018-07-16 DIAGNOSIS — F902 Attention-deficit hyperactivity disorder, combined type: Secondary | ICD-10-CM | POA: Diagnosis not present

## 2018-07-16 DIAGNOSIS — F429 Obsessive-compulsive disorder, unspecified: Secondary | ICD-10-CM | POA: Diagnosis not present

## 2018-07-16 DIAGNOSIS — F4010 Social phobia, unspecified: Secondary | ICD-10-CM | POA: Diagnosis not present

## 2018-07-16 DIAGNOSIS — Z79899 Other long term (current) drug therapy: Secondary | ICD-10-CM | POA: Diagnosis not present

## 2018-07-16 DIAGNOSIS — R41840 Attention and concentration deficit: Secondary | ICD-10-CM | POA: Diagnosis not present

## 2018-07-16 DIAGNOSIS — F338 Other recurrent depressive disorders: Secondary | ICD-10-CM | POA: Diagnosis not present

## 2018-07-16 MED FILL — VYVANSE 20 MG CAPSULE: 20 | 30 days supply | Qty: 30 | Fill #0

## 2018-07-26 DIAGNOSIS — G4733 Obstructive sleep apnea (adult) (pediatric): Secondary | ICD-10-CM | POA: Diagnosis not present

## 2018-08-03 MED FILL — VYVANSE 40 MG CAPSULE: 40 | 30 days supply | Qty: 30 | Fill #0

## 2018-08-25 DIAGNOSIS — G4733 Obstructive sleep apnea (adult) (pediatric): Secondary | ICD-10-CM | POA: Diagnosis not present

## 2018-09-02 MED FILL — SUMAtriptan SUCCINATE 50 MG: 50 | 30 days supply | Qty: 9 | Fill #2

## 2018-09-03 DIAGNOSIS — E039 Hypothyroidism, unspecified: Secondary | ICD-10-CM | POA: Diagnosis not present

## 2018-09-03 DIAGNOSIS — G4733 Obstructive sleep apnea (adult) (pediatric): Secondary | ICD-10-CM | POA: Diagnosis not present

## 2018-09-03 DIAGNOSIS — F419 Anxiety disorder, unspecified: Secondary | ICD-10-CM | POA: Diagnosis not present

## 2018-09-03 DIAGNOSIS — Z79899 Other long term (current) drug therapy: Secondary | ICD-10-CM | POA: Diagnosis not present

## 2018-09-03 DIAGNOSIS — F338 Other recurrent depressive disorders: Secondary | ICD-10-CM | POA: Diagnosis not present

## 2018-09-03 DIAGNOSIS — F902 Attention-deficit hyperactivity disorder, combined type: Secondary | ICD-10-CM | POA: Diagnosis not present

## 2018-09-03 DIAGNOSIS — R41840 Attention and concentration deficit: Secondary | ICD-10-CM | POA: Diagnosis not present

## 2018-09-03 DIAGNOSIS — E559 Vitamin D deficiency, unspecified: Secondary | ICD-10-CM | POA: Diagnosis not present

## 2018-09-03 DIAGNOSIS — F4312 Post-traumatic stress disorder, chronic: Secondary | ICD-10-CM | POA: Diagnosis not present

## 2018-09-03 MED FILL — VYVANSE 60 MG CHEW: 60 | 30 days supply | Qty: 30 | Fill #0

## 2018-09-07 DIAGNOSIS — R7303 Prediabetes: Secondary | ICD-10-CM | POA: Diagnosis not present

## 2018-09-07 DIAGNOSIS — E782 Mixed hyperlipidemia: Secondary | ICD-10-CM | POA: Diagnosis not present

## 2018-09-07 DIAGNOSIS — E559 Vitamin D deficiency, unspecified: Secondary | ICD-10-CM | POA: Diagnosis not present

## 2018-09-07 DIAGNOSIS — E039 Hypothyroidism, unspecified: Secondary | ICD-10-CM | POA: Diagnosis not present

## 2018-09-25 DIAGNOSIS — G4733 Obstructive sleep apnea (adult) (pediatric): Secondary | ICD-10-CM | POA: Diagnosis not present

## 2018-10-12 MED FILL — SYNTHROID 150 MCG TABLET: 150 | 90 days supply | Qty: 45 | Fill #0

## 2018-10-13 MED FILL — VYVANSE 60 MG CHEW: 60 | 30 days supply | Qty: 30 | Fill #0

## 2018-10-15 DIAGNOSIS — R7303 Prediabetes: Secondary | ICD-10-CM | POA: Diagnosis not present

## 2018-10-15 DIAGNOSIS — E039 Hypothyroidism, unspecified: Secondary | ICD-10-CM | POA: Diagnosis not present

## 2018-10-15 DIAGNOSIS — E782 Mixed hyperlipidemia: Secondary | ICD-10-CM | POA: Diagnosis not present

## 2018-10-15 DIAGNOSIS — E559 Vitamin D deficiency, unspecified: Secondary | ICD-10-CM | POA: Diagnosis not present

## 2018-10-26 DIAGNOSIS — G4733 Obstructive sleep apnea (adult) (pediatric): Secondary | ICD-10-CM | POA: Diagnosis not present

## 2018-11-12 MED FILL — VYVANSE 60 MG CHEW: 60 | 30 days supply | Qty: 30 | Fill #0

## 2018-11-25 DIAGNOSIS — F338 Other recurrent depressive disorders: Secondary | ICD-10-CM | POA: Diagnosis not present

## 2018-11-25 DIAGNOSIS — F419 Anxiety disorder, unspecified: Secondary | ICD-10-CM | POA: Diagnosis not present

## 2018-11-25 DIAGNOSIS — F902 Attention-deficit hyperactivity disorder, combined type: Secondary | ICD-10-CM | POA: Diagnosis not present

## 2018-11-25 DIAGNOSIS — Z79899 Other long term (current) drug therapy: Secondary | ICD-10-CM | POA: Diagnosis not present

## 2018-11-25 DIAGNOSIS — G4733 Obstructive sleep apnea (adult) (pediatric): Secondary | ICD-10-CM | POA: Diagnosis not present

## 2018-11-25 DIAGNOSIS — F4312 Post-traumatic stress disorder, chronic: Secondary | ICD-10-CM | POA: Diagnosis not present

## 2018-12-07 MED FILL — SYNTHROID 150 MCG TABLET: 150 | 90 days supply | Qty: 90 | Fill #0

## 2018-12-14 MED FILL — VYVANSE 40 MG CAPSULE: 40 | 30 days supply | Qty: 30 | Fill #0

## 2018-12-14 MED FILL — VYVANSE 30 MG CAPSULE: 30 | 30 days supply | Qty: 30 | Fill #0

## 2018-12-21 MED FILL — ALPRAZolam 0.25 MG TABS: 0.25 | 30 days supply | Qty: 30 | Fill #0

## 2019-02-09 MED FILL — VYVANSE 60 MG CHEW: 60 | 30 days supply | Qty: 30 | Fill #0

## 2019-02-26 DIAGNOSIS — E039 Hypothyroidism, unspecified: Secondary | ICD-10-CM | POA: Diagnosis not present

## 2019-02-26 DIAGNOSIS — E559 Vitamin D deficiency, unspecified: Secondary | ICD-10-CM | POA: Diagnosis not present

## 2019-02-26 DIAGNOSIS — R7303 Prediabetes: Secondary | ICD-10-CM | POA: Diagnosis not present

## 2019-02-26 DIAGNOSIS — G4733 Obstructive sleep apnea (adult) (pediatric): Secondary | ICD-10-CM | POA: Diagnosis not present

## 2019-02-26 DIAGNOSIS — G43909 Migraine, unspecified, not intractable, without status migrainosus: Secondary | ICD-10-CM | POA: Diagnosis not present

## 2019-02-26 DIAGNOSIS — L709 Acne, unspecified: Secondary | ICD-10-CM | POA: Diagnosis not present

## 2019-02-26 DIAGNOSIS — F419 Anxiety disorder, unspecified: Secondary | ICD-10-CM | POA: Diagnosis not present

## 2019-02-26 DIAGNOSIS — F909 Attention-deficit hyperactivity disorder, unspecified type: Secondary | ICD-10-CM | POA: Diagnosis not present

## 2019-02-26 DIAGNOSIS — E781 Pure hyperglyceridemia: Secondary | ICD-10-CM | POA: Diagnosis not present

## 2019-02-26 DIAGNOSIS — E782 Mixed hyperlipidemia: Secondary | ICD-10-CM | POA: Diagnosis not present

## 2019-02-26 MED FILL — TRETINOIN 0.025% CREAM: 0.025 | 30 days supply | Qty: 45 | Fill #0

## 2019-03-16 DIAGNOSIS — Z79899 Other long term (current) drug therapy: Secondary | ICD-10-CM | POA: Diagnosis not present

## 2019-03-16 DIAGNOSIS — G4733 Obstructive sleep apnea (adult) (pediatric): Secondary | ICD-10-CM | POA: Diagnosis not present

## 2019-03-16 DIAGNOSIS — F419 Anxiety disorder, unspecified: Secondary | ICD-10-CM | POA: Diagnosis not present

## 2019-03-16 DIAGNOSIS — F338 Other recurrent depressive disorders: Secondary | ICD-10-CM | POA: Diagnosis not present

## 2019-03-16 DIAGNOSIS — F902 Attention-deficit hyperactivity disorder, combined type: Secondary | ICD-10-CM | POA: Diagnosis not present

## 2019-03-16 MED FILL — AMPHETAMINE-DEXTROAMPHETAMI: 10 | 30 days supply | Qty: 30 | Fill #0

## 2019-03-16 MED FILL — VYVANSE 60 MG CHEW: 60 | 30 days supply | Qty: 30 | Fill #0

## 2019-03-22 DIAGNOSIS — G43909 Migraine, unspecified, not intractable, without status migrainosus: Secondary | ICD-10-CM | POA: Diagnosis not present

## 2019-03-22 DIAGNOSIS — E039 Hypothyroidism, unspecified: Secondary | ICD-10-CM | POA: Diagnosis not present

## 2019-03-22 MED FILL — SUMAtriptan SUCCINATE 50 MG: 50 | 30 days supply | Qty: 9 | Fill #0

## 2019-04-21 MED FILL — VYVANSE 60 MG CHEW: 60 | 30 days supply | Qty: 30 | Fill #0

## 2019-04-26 MED FILL — SUMAtriptan SUCCINATE 50 MG: 50 | 30 days supply | Qty: 9 | Fill #0

## 2019-05-17 MED FILL — SYNTHROID 150 MCG TABLET: 150 | 90 days supply | Qty: 90 | Fill #0

## 2019-05-17 MED FILL — SUMAtriptan SUCCINATE 50 MG: 50 | 30 days supply | Qty: 9 | Fill #0

## 2019-05-27 MED FILL — AMPHETAMINE-DEXTROAMPHETAMI: 10 | 30 days supply | Qty: 30 | Fill #0

## 2019-05-27 MED FILL — VYVANSE 60 MG CHEW: 60 | 30 days supply | Qty: 30 | Fill #0

## 2019-06-15 DIAGNOSIS — F419 Anxiety disorder, unspecified: Secondary | ICD-10-CM | POA: Diagnosis not present

## 2019-06-15 DIAGNOSIS — F338 Other recurrent depressive disorders: Secondary | ICD-10-CM | POA: Diagnosis not present

## 2019-06-15 DIAGNOSIS — Z79899 Other long term (current) drug therapy: Secondary | ICD-10-CM | POA: Diagnosis not present

## 2019-06-15 DIAGNOSIS — F902 Attention-deficit hyperactivity disorder, combined type: Secondary | ICD-10-CM | POA: Diagnosis not present

## 2019-06-15 DIAGNOSIS — G4733 Obstructive sleep apnea (adult) (pediatric): Secondary | ICD-10-CM | POA: Diagnosis not present
